# Patient Record
Sex: Male | Born: 1955 | Race: White | Hispanic: No | Marital: Single | Smoking: Never smoker
Health system: Southern US, Community
[De-identification: ages and names within clinical notes are randomized; demographics above are authoritative.]

## PROBLEM LIST (undated history)

## (undated) DIAGNOSIS — T8859XA Other complications of anesthesia, initial encounter: Secondary | ICD-10-CM

## (undated) DIAGNOSIS — N189 Chronic kidney disease, unspecified: Secondary | ICD-10-CM

## (undated) HISTORY — PX: SHOULDER ARTHROSCOPY: SHX128

## (undated) HISTORY — PX: BACK SURGERY: SHX140

---

## 2000-09-06 HISTORY — PX: TENDON REPAIR: SHX5111

## 2017-08-25 ENCOUNTER — Other Ambulatory Visit: Payer: Self-pay | Admitting: Nephrology

## 2017-08-25 DIAGNOSIS — N183 Chronic kidney disease, stage 3 unspecified: Secondary | ICD-10-CM

## 2017-09-01 ENCOUNTER — Ambulatory Visit
Admission: RE | Admit: 2017-09-01 | Discharge: 2017-09-01 | Disposition: A | Discharge status: Home / Self Care | Payer: 59 | Source: Ambulatory Visit | Attending: Nephrology | Admitting: Nephrology

## 2017-09-01 DIAGNOSIS — N183 Chronic kidney disease, stage 3 unspecified: Secondary | ICD-10-CM

## 2017-09-23 ENCOUNTER — Telehealth: Payer: Self-pay | Admitting: Hematology and Oncology

## 2017-09-23 NOTE — Telephone Encounter (Signed)
Spoke with and also left message on voicemail per his request with appointment date/time/location/phone #

## 2017-10-13 ENCOUNTER — Other Ambulatory Visit: Payer: Self-pay

## 2017-10-13 ENCOUNTER — Telehealth: Payer: Self-pay | Admitting: Hematology and Oncology

## 2017-10-13 ENCOUNTER — Inpatient Hospital Stay: Payer: 59 | Attending: Hematology and Oncology | Admitting: Hematology and Oncology

## 2017-10-13 ENCOUNTER — Inpatient Hospital Stay: Payer: 59

## 2017-10-13 ENCOUNTER — Encounter: Payer: Self-pay | Admitting: Hematology and Oncology

## 2017-10-13 VITALS — BP 139/83 | HR 73 | Temp 97.7°F | Resp 18 | Ht 70.0 in | Wt 229.6 lb

## 2017-10-13 DIAGNOSIS — Z7982 Long term (current) use of aspirin: Secondary | ICD-10-CM | POA: Diagnosis not present

## 2017-10-13 DIAGNOSIS — D472 Monoclonal gammopathy: Secondary | ICD-10-CM

## 2017-10-13 DIAGNOSIS — Z79899 Other long term (current) drug therapy: Secondary | ICD-10-CM | POA: Diagnosis not present

## 2017-10-13 DIAGNOSIS — N189 Chronic kidney disease, unspecified: Secondary | ICD-10-CM | POA: Insufficient documentation

## 2017-10-13 DIAGNOSIS — D801 Nonfamilial hypogammaglobulinemia: Secondary | ICD-10-CM | POA: Insufficient documentation

## 2017-10-13 LAB — CBC WITH DIFFERENTIAL (CANCER CENTER ONLY)
BASOS ABS: 0 10*3/uL (ref 0.0–0.1)
BASOS PCT: 0 %
EOS ABS: 0.3 10*3/uL (ref 0.0–0.5)
Eosinophils Relative: 4 %
HCT: 47.9 % (ref 38.4–49.9)
HEMOGLOBIN: 16.5 g/dL (ref 13.0–17.1)
Lymphocytes Relative: 21 %
Lymphs Abs: 1.5 10*3/uL (ref 0.9–3.3)
MCH: 32 pg (ref 27.2–33.4)
MCHC: 34.4 g/dL (ref 32.0–36.0)
MCV: 93 fL (ref 79.3–98.0)
MONOS PCT: 8 %
Monocytes Absolute: 0.6 10*3/uL (ref 0.1–0.9)
NEUTROS ABS: 4.9 10*3/uL (ref 1.5–6.5)
Neutrophils Relative %: 67 %
PLATELETS: 318 10*3/uL (ref 140–400)
RBC: 5.15 MIL/uL (ref 4.20–5.82)
RDW: 12.8 % (ref 11.0–14.6)
WBC Count: 7.3 10*3/uL (ref 4.0–10.3)
nRBC: 0 /100 WBC

## 2017-10-13 LAB — CMP (CANCER CENTER ONLY)
ALBUMIN: 3.3 g/dL — AB (ref 3.5–5.0)
ALK PHOS: 51 U/L (ref 40–150)
ALT: 32 U/L (ref 0–55)
ANION GAP: 8 (ref 3–11)
AST: 29 U/L (ref 5–34)
BUN: 21 mg/dL (ref 7–26)
CHLORIDE: 103 mmol/L (ref 98–109)
CO2: 29 mmol/L (ref 22–29)
Calcium: 9.2 mg/dL (ref 8.4–10.4)
Creatinine: 1.59 mg/dL — ABNORMAL HIGH (ref 0.70–1.30)
GFR, EST AFRICAN AMERICAN: 52 mL/min — AB (ref >60)
GFR, Estimated: 45 mL/min — ABNORMAL LOW (ref >60)
GLUCOSE: 89 mg/dL (ref 70–140)
Potassium: 4.5 mmol/L (ref 3.5–5.1)
SODIUM: 140 mmol/L (ref 136–145)
Total Bilirubin: 0.4 mg/dL (ref 0.2–1.2)
Total Protein: 6.8 g/dL (ref 6.4–8.3)

## 2017-10-13 LAB — URIC ACID: Uric Acid, Serum: 5.6 mg/dL (ref 2.6–7.4)

## 2017-10-13 LAB — LACTATE DEHYDROGENASE: LDH: 160 U/L (ref 125–245)

## 2017-10-13 NOTE — Telephone Encounter (Signed)
Patient scheduled and AVS/Calendar printed per 2/7 los

## 2017-10-14 LAB — KAPPA/LAMBDA LIGHT CHAINS
Kappa free light chain: 84.9 mg/L — ABNORMAL HIGH (ref 3.3–19.4)
Kappa, lambda light chain ratio: 6.48 — ABNORMAL HIGH (ref 0.26–1.65)
LAMDA FREE LIGHT CHAINS: 13.1 mg/L (ref 5.7–26.3)

## 2017-10-14 LAB — BETA 2 MICROGLOBULIN, SERUM: BETA 2 MICROGLOBULIN: 1.8 mg/L (ref 0.6–2.4)

## 2017-10-15 LAB — VISCOSITY, SERUM: Viscosity, Serum: 1.7 rel.saline (ref 1.6–1.9)

## 2017-10-17 LAB — MULTIPLE MYELOMA PANEL, SERUM
ALBUMIN SERPL ELPH-MCNC: 3.2 g/dL (ref 2.9–4.4)
ALPHA 1: 0.2 g/dL (ref 0.0–0.4)
ALPHA2 GLOB SERPL ELPH-MCNC: 1 g/dL (ref 0.4–1.0)
Albumin/Glob SerPl: 1.1 (ref 0.7–1.7)
B-Globulin SerPl Elph-Mcnc: 0.9 g/dL (ref 0.7–1.3)
GAMMA GLOB SERPL ELPH-MCNC: 1.1 g/dL (ref 0.4–1.8)
GLOBULIN, TOTAL: 3.2 g/dL (ref 2.2–3.9)
IGA: 195 mg/dL (ref 61–437)
IGG (IMMUNOGLOBIN G), SERUM: 1111 mg/dL (ref 700–1600)
IgM (Immunoglobulin M), Srm: 30 mg/dL (ref 20–172)
M Protein SerPl Elph-Mcnc: 0.7 g/dL — ABNORMAL HIGH
Total Protein ELP: 6.4 g/dL (ref 6.0–8.5)

## 2017-10-21 ENCOUNTER — Inpatient Hospital Stay (HOSPITAL_BASED_OUTPATIENT_CLINIC_OR_DEPARTMENT_OTHER): Payer: 59 | Admitting: Hematology and Oncology

## 2017-10-21 ENCOUNTER — Telehealth: Payer: Self-pay

## 2017-10-21 ENCOUNTER — Encounter: Payer: Self-pay | Admitting: Hematology and Oncology

## 2017-10-21 VITALS — BP 135/86 | HR 64 | Temp 97.8°F | Resp 18 | Ht 70.0 in | Wt 231.4 lb

## 2017-10-21 DIAGNOSIS — D472 Monoclonal gammopathy: Secondary | ICD-10-CM | POA: Diagnosis not present

## 2017-10-21 NOTE — Telephone Encounter (Signed)
Printed avs and calender for upcoming appointment. Per 2/15 los

## 2017-10-24 LAB — UIFE/LIGHT CHAINS/TP QN, 24-HR UR
% BETA, Urine: 18.1 %
ALPHA 1 URINE: 2.8 %
Albumin, U: 67 %
Alpha 2, Urine: 4.6 %
FREE KAPPA LT CHAINS, UR: 997 mg/L — AB (ref 1.35–24.19)
Free Kappa/Lambda Ratio: 209.89 — ABNORMAL HIGH (ref 2.04–10.37)
Free Lambda Lt Chains,Ur: 4.75 mg/L (ref 0.24–6.66)
GAMMA GLOBULIN URINE: 7.5 %
M-SPIKE %, URINE: 8.2 % — AB
M-Spike, Mg/24 Hr: 149 mg/24 hr — ABNORMAL HIGH
Total Protein, Urine-Ur/day: 1815 mg/24 hr — ABNORMAL HIGH (ref 30–150)
Total Protein, Urine: 93.1 mg/dL
Total Volume: 1950

## 2017-10-30 ENCOUNTER — Other Ambulatory Visit: Payer: Self-pay | Admitting: Radiology

## 2017-10-31 ENCOUNTER — Ambulatory Visit (HOSPITAL_COMMUNITY): Payer: 59

## 2017-11-01 ENCOUNTER — Encounter (HOSPITAL_COMMUNITY): Payer: 59

## 2017-11-03 ENCOUNTER — Encounter: Payer: Self-pay | Admitting: Hematology and Oncology

## 2017-11-03 ENCOUNTER — Telehealth: Payer: Self-pay

## 2017-11-03 DIAGNOSIS — D472 Monoclonal gammopathy: Secondary | ICD-10-CM

## 2017-11-03 HISTORY — DX: Monoclonal gammopathy: D47.2

## 2017-11-03 NOTE — Progress Notes (Signed)
[name] Follow-up Visit:  Assessment: [name] (monoclonal gammopathy of unknown significance) 62 y.o. 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  Repeat evaluation confirms presence and indicates possible progression of the monoclonal gammopathy over the  intervening 6 weeks.  Further evaluation is clearly indicated due to significant risk of multiple myeloma presence.  Plan: -PET/CT -Consult interventional radiology for bone marrow biopsy -Return to my clinic 2 weeks after bone marrow biopsy.  If testing is negative for multiple myeloma, will proceed with assessment for possible presence of amyloidosis..  Voice recognition software was used and creation of this note. Despite my best effort at editing the text, some misspelling/errors may have occurred.  Orders Placed This Encounter  Procedures  . NM PET Image Initial (PI) Whole Body    Standing Status:   Future    Standing Expiration Date:   10/21/2018    Order Specific Question:   If indicated for the ordered procedure, I authorize the administration of a radiopharmaceutical per Radiology protocol    Answer:   Yes    Order Specific Question:   Preferred imaging location?    Answer:   [facility]    Order Specific Question:   Radiology Contrast Protocol - do NOT remove file path    Answer:   \\charchive\epicdata\Radiant\NMPROTOCOLS.pdf    Order Specific Question:   Reason for Exam additional comments    Answer:   [name], please evaluate for evidence of skeletal lesions  . CT Biopsy    Standing Status:   Future    Standing Expiration Date:   10/21/2018    Order Specific Question:   Lab orders requested (DO NOT place separate lab orders, these will be automatically ordered during procedure specimen collection):    Answer:   Cytology - Non Pap    Comments:   Flow cyto, Cytogenetics, MM FISH panel, molecular studies for MM    Order Specific Question:   Lab  orders requested (DO NOT place separate lab orders, these will be automatically ordered during procedure specimen collection):    Answer:   Surgical Pathology    Order Specific Question:   Lab orders requested (DO NOT place separate lab orders, these will be automatically ordered during procedure specimen collection):    Answer:   Other    Order Specific Question:   Lab orders requested (DO NOT place separate lab orders, these will be automatically ordered during procedure specimen collection):    Answer:   Oligo clonal bands    Order Specific Question:   Reason for Exam (SYMPTOM  OR DIAGNOSIS REQUIRED)    Answer:   [name] -- please eval for evidence of MM    Order Specific Question:   Preferred imaging location?    Answer:   [facility]    Order Specific Question:   Radiology Contrast Protocol - do NOT remove file path    Answer:   \\charchive\epicdata\Radiant\CTProtocols.pdf  . CT BONE MARROW BIOPSY & ASPIRATION    Standing Status:   Future    Standing Expiration Date:   01/19/2019    Order Specific Question:   Reason for Exam (SYMPTOM  OR DIAGNOSIS REQUIRED)    Answer:   [name], please eval for Multiple myeloma    Order Specific Question:   Preferred imaging location?    Answer:   [facility]    Order Specific Question:   Radiology Contrast Protocol - do NOT remove file path  Answer:   \\charchive\epicdata\Radiant\CTProtocols.pdf  . IFE/Light Chains/TP Qn, 24-Hr Ur    Cancer Staging No matching staging information was found for the patient.  All questions were answered.   The patient knows to call the clinic with any problems, questions or concerns.  This note was electronically signed.    History of Presenting Illness Kevin [name] is a 62 y.o. male followed in the [place] for evaluation of monoclonal gammopathy of unknown significance, referred by Dr Madelon Lips.  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  Patient was also found to have proteinuria and anemia.  Initial testing outlined below demonstrated presence of a small band of monoclonal protein as well as profound pan hypogammaglobulinemia.  With those findings, he was referred to our evaluation.  At this time, patient denies any fever, chills, night sweats.  Denies unexpected weight loss or weight gain.  He lives a very healthy and active lifestyle and has not noted any new limitations to his activities.  Patient does have chronic mild neck and back arthritis which has not worsened recently.  Patient denies any numbness, tingling, or other sensory abnormalities in his extremities.  Denies any chest pain, shortness of breath, or cough.  No nausea, vomiting, abdominal pain, constipation, or urinary retention.  Oncological/hematological History: --Labs, 08/23/17: tProt   ..., Alb 4.1, Ca 9.5, Cr 1.5, AP .Marland Kitchen.;             SPEP -- MSpike 0.5g/dL, IFE -- IgG kappa; IgG  <30, IgA   <5, IgM <5; kappa 75.2, lambda 11.3, KLR 6.65; WBC 7.6, Hgb 16.9, MCV 90.0, MCH 31.5, MCHC 34.8, RDW 13.4, Plt 249; ANA negative, C3 115, C4 34; HBV, RPR, HIV -- negative;  --US Renal, 09/01/17: Normal renal ultrasound --Labs, 10/13/17: tProt 6.8, Alb 3.3, Ca 9.2, Cr 1.6, AP 51, LDH 160, beta-2 microglobulin 1.8; SPEP -- MSpike 0.7g/dL, IFE -- IgG kappa; IgG 1111, IgA 195, IgM 30; kappa 84.9, lambda 13.1, KLR 6.48; WBC 7.3, Hgb 16.5,        Plt 318;    No history exists.    Medical History: No past medical history on file.  Surgical History: Past Surgical History:  Procedure Laterality Date  . SHOULDER ARTHROSCOPY Left   . TENDON REPAIR Left 2002    Family History: Family History  Problem Relation Age of Onset  . Heart disease Mother   . Heart disease Father   . Heart attack Brother     Social History: Social History   Socioeconomic  History  . Marital status: Single    Spouse name: Not on file  . Number of children: Not on file  . Years of education: Not on file  . Highest education level: Not on file  Social Needs  . Financial resource strain: Not on file  . Food insecurity - worry: Not on file  . Food insecurity - inability: Not on file  . Transportation needs - medical: Not on file  . Transportation needs - non-medical: Not on file  Occupational History  . Not on file  Tobacco Use  . Smoking status: Never Smoker  . Smokeless tobacco: Never Used  Substance and Sexual Activity  . Alcohol use: No    Frequency: Never  . Drug use: No  . Sexual activity: Not on file  Other Topics Concern  . Not on file  Social History Narrative  . Not on file    Allergies: No Known Allergies  Medications:  Current Outpatient Medications  Medication Sig Dispense Refill  . aspirin 81 MG chewable tablet Chew 81 mg by mouth daily.     No current facility-administered medications for this visit.     Review of Systems: Review of Systems  All other systems reviewed and are negative.    PHYSICAL EXAMINATION Blood pressure 135/86, pulse 64, temperature 97.8 F (36.6 C), temperature source Oral, resp. rate 18, height '5\' 10"'  (1.778 m), weight 231 lb 6.4 oz (105 kg), SpO2 96 %.  ECOG PERFORMANCE STATUS: 0 - Asymptomatic  Physical Exam  Constitutional: He is oriented to person, place, and time and well-developed, well-nourished, and in no distress. No distress.  HENT:  Head: Normocephalic and atraumatic.  Right Ear: External ear normal.  Mouth/Throat: Oropharynx is clear and moist. No oropharyngeal exudate.  Eyes: Conjunctivae and EOM are normal. Pupils are equal, round, and reactive to light. No scleral icterus.  Neck: No thyromegaly present.  Cardiovascular: Normal rate, regular rhythm and normal heart sounds.  No murmur heard. Pulmonary/Chest: Effort normal and breath sounds normal. No respiratory distress. He has  no wheezes. He has no rales.  Abdominal: Soft. Bowel sounds are normal. He exhibits no distension and no mass. There is no tenderness. There is no guarding.  Musculoskeletal: He exhibits no edema.  Lymphadenopathy:    He has no cervical adenopathy.  Neurological: He is alert and oriented to person, place, and time. He has normal reflexes. No cranial nerve deficit.  Skin: Skin is warm and dry. No rash noted. He is not diaphoretic. No erythema. No pallor.     LABORATORY DATA: I have personally reviewed the data as listed: Office Visit on 10/21/2017  Component Date Value Ref Range Status  . Total Protein, Urine 10/21/2017 93.1  Not Estab. mg/dL Final  . Total Protein, Urine-Ur/day 10/21/2017 1,815* 30 - 150 mg/24 hr Final  . Albumin, U 10/21/2017 67.0  % Final  . ALPHA 1 URINE 10/21/2017 2.8  % Final  . Alpha 2, Urine 10/21/2017 4.6  % Final  . % BETA, Urine 10/21/2017 18.1  % Final  . GAMMA GLOBULIN URINE 10/21/2017 7.5  % Final  . Free Kappa Lt Chains,Ur 10/21/2017 997.00* 1.35 - 24.19 mg/L Final   **Results verified by repeat testing**  . Free Lambda Lt Chains,Ur 10/21/2017 4.75  0.24 - 6.66 mg/L Corrected   **Results verified by repeat testing**  . Free Kappa/Lambda Ratio 10/21/2017 209.89* 2.04 - 10.37 Corrected   Comment: (NOTE) Performed At: [facility] [address] [place], [place] [id number] Rush Farmer MD [id number]   . Immunofixation Result, Urine 10/21/2017 Comment   Corrected   Comment: (NOTE) Bence Jones Protein positive; kappa type. Immunofixation shows IgG monoclonal protein with kappa light chain specificity.   . Total Volume 10/21/2017 1,950   Final  . M-SPIKE %, Urine 10/21/2017 8.2* Not Observed % Corrected   Comment: An additional M spike was observed at a concentration of 4.0%.   Marland Kitchen M-Spike, Mg/24 Hr 10/21/2017 149* Not Observed mg/24 hr Corrected  . Note: 10/21/2017 Comment   Corrected   Comment: (NOTE) Protein electrophoresis scan will  follow via computer, mail, or courier delivery. Performed at [facility], [place] [address], [place], [place] [address]        Ardath Sax, MD

## 2017-11-03 NOTE — Telephone Encounter (Signed)
Matt from [facility] called requesting the progress note from patients office visit from 10/13/2017 and 10/21/2017.  This RN requested that Dr. Lebron Conners complete his notes.  The progress notes will be faxed to [facility] as soon as the notes are complete.

## 2017-11-03 NOTE — Assessment & Plan Note (Signed)
62 y.o. 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  Repeat evaluation confirms presence and indicates possible progression of the monoclonal gammopathy over the  intervening 6 weeks.  Further evaluation is clearly indicated due to significant risk of multiple myeloma presence.  Plan: -PET/CT -Consult interventional radiology for bone marrow biopsy -Return to my clinic 2 weeks after bone marrow biopsy.  If testing is negative for multiple myeloma, will proceed with assessment for possible presence of amyloidosis.Marland Kitchen

## 2017-11-03 NOTE — Progress Notes (Signed)
[place] [facility] New Visit:  Assessment: MGUS (monoclonal gammopathy of unknown significance) 62 y.o. 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  Findings are of unclear significance at this point in time and additional evaluation for possible presence of multiple myeloma is clearly indicated.  Plan: -Labs today as outlined below. -Skeletal survey. - Return to clinic in 1 week for review.  Voice recognition software was used and creation of this note. Despite my best effort at editing the text, some misspelling/errors may have occurred.  Orders Placed This Encounter  Procedures  . DG Bone Survey Met    Standing Status:   Future    Standing Expiration Date:   12/12/2018    Order Specific Question:   Reason for Exam (SYMPTOM  OR DIAGNOSIS REQUIRED)    Answer:   MGUS, please eval for lytic lesions to suggest Multiple myeloma    Order Specific Question:   Preferred imaging location?    Answer:   [facility]    Order Specific Question:   Radiology Contrast Protocol - do NOT remove file path    Answer:   \\charchive\epicdata\Radiant\DXFluoroContrastProtocols.pdf  . CBC with Differential ([place] Only)    Standing Status:   Future    Number of Occurrences:   1    Standing Expiration Date:   10/13/2018  . CMP ([place] only)    Standing Status:   Future    Number of Occurrences:   1    Standing Expiration Date:   10/13/2018  . Lactate dehydrogenase (LDH)    Standing Status:   Future    Number of Occurrences:   1    Standing Expiration Date:   10/13/2018  . Uric acid    Standing Status:   Future    Number of Occurrences:   1    Standing Expiration Date:   10/13/2018  . Beta 2 microglobulin    Standing Status:   Future    Number of Occurrences:   1    Standing Expiration Date:   10/13/2018  . Multiple Myeloma Panel (SPEP&IFE w/QIG)    Standing Status:   Future    Number of Occurrences:   1    Standing  Expiration Date:   10/13/2018  . Kappa/lambda light chains    Standing Status:   Future    Number of Occurrences:   1    Standing Expiration Date:   10/13/2018  . Viscosity, serum    Standing Status:   Future    Number of Occurrences:   1    Standing Expiration Date:   10/13/2018  . 24-Hr Ur UPEP/UIFE/Light Chains/TP    Standing Status:   Future    Number of Occurrences:   1    Standing Expiration Date:   10/13/2018    All questions were answered. . The patient knows to call the clinic with any problems, questions or concerns.  This note was electronically signed.    History of Presenting Illness Kevin [name] 62 y.o. presenting to the [facility] for evaluation of monoclonal gammopathy of unknown significance, referred by Dr Madelon Lips.  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  Patient was also found to have proteinuria and anemia.  Initial testing outlined below demonstrated presence of a small band of monoclonal protein as well as profound  pan hypogammaglobulinemia.  With those findings, he was referred to our evaluation.  At this time, patient denies any fever, chills, night sweats.  Denies unexpected weight loss or weight gain.  He lives a very healthy and active lifestyle and has not noted any new limitations to his activities.  Patient does have chronic mild neck and back arthritis which has not worsened recently.  Patient denies any numbness, tingling, or other sensory abnormalities in his extremities.  Denies any chest pain, shortness of breath, or cough.  No nausea, vomiting, abdominal pain, constipation, or urinary retention.  Oncological/hematological History: --Labs, 08/23/17: tProt ..., Alb 4.1, Ca 9.5, Cr 1.5, AP ...; SPEP -- MSpike 0.5g/dL, IFE -- IgG kappa; IgG <30, IgA <5, IgM <5; kappa 75.2, lambda 11.3,  KLR 6.65; WBC 7.6, Hgb 16.9, MCV 90.0, MCH 31.5, MCHC 34.8, RDW 13.4, Plt 249; ANA negative, C3 115, C4 34; HBV, RPR, HIV -- negative;  --US Renal, 09/01/17: Normal renal ultrasound   Medical History: History reviewed. No pertinent past medical history.  Surgical History: Past Surgical History:  Procedure Laterality Date  . SHOULDER ARTHROSCOPY Left   . TENDON REPAIR Left 2002    Family History: Family History  Problem Relation Age of Onset  . Heart disease Mother   . Heart disease Father   . Heart attack Brother     Social History: Social History   Socioeconomic History  . Marital status: Single    Spouse name: Not on file  . Number of children: Not on file  . Years of education: Not on file  . Highest education level: Not on file  Social Needs  . Financial resource strain: Not on file  . Food insecurity - worry: Not on file  . Food insecurity - inability: Not on file  . Transportation needs - medical: Not on file  . Transportation needs - non-medical: Not on file  Occupational History  . Not on file  Tobacco Use  . Smoking status: Never Smoker  . Smokeless tobacco: Never Used  Substance and Sexual Activity  . Alcohol use: No    Frequency: Never  . Drug use: No  . Sexual activity: Not on file  Other Topics Concern  . Not on file  Social History Narrative  . Not on file    Allergies: No Known Allergies  Medications:  Current Outpatient Medications  Medication Sig Dispense Refill  . aspirin 81 MG chewable tablet Chew 81 mg by mouth daily.     No current facility-administered medications for this visit.     Review of Systems: Review of Systems  All other systems reviewed and are negative.    PHYSICAL EXAMINATION Blood pressure 139/83, pulse 73, temperature 97.7 F (36.5 C), temperature source Oral, resp. rate 18, height _0  (1.778 m), weight 229 lb 9.6 oz (104.1 kg), SpO2 98 %.  ECOG PERFORMANCE STATUS: 0 - Asymptomatic  Physical Exam   Constitutional: He is oriented to person, place, and time and well-developed, well-nourished, and in no distress. No distress.  HENT:  Head: Normocephalic and atraumatic.  Right Ear: External ear normal.  Mouth/Throat: Oropharynx is clear and moist. No oropharyngeal exudate.  Eyes: Conjunctivae and EOM are normal. Pupils are equal, round, and reactive to light. No scleral icterus.  Neck: No thyromegaly present.  Cardiovascular: Normal rate, regular rhythm and normal heart sounds.  No murmur heard. Pulmonary/Chest: Effort normal and breath sounds normal. No respiratory distress. He has no wheezes. He has no rales.  Abdominal: Soft.  Bowel sounds are normal. He exhibits no distension and no mass. There is no tenderness. There is no guarding.  Musculoskeletal: He exhibits no edema.  Lymphadenopathy:    He has no cervical adenopathy.  Neurological: He is alert and oriented to person, place, and time. He has normal reflexes. No cranial nerve deficit.  Skin: Skin is warm and dry. No rash noted. He is not diaphoretic. No erythema. No pallor.     LABORATORY DATA: I have personally reviewed the data as listed: Appointment on 10/13/2017  Component Date Value Ref Range Status  . WBC Count 10/13/2017 7.3  4.0 - 10.3 K/uL Final  . RBC 10/13/2017 5.15  4.20 - 5.82 MIL/uL Final  . Hemoglobin 10/13/2017 16.5  13.0 - 17.1 g/dL Final  . HCT 10/13/2017 47.9  38.4 - 49.9 % Final  . MCV 10/13/2017 93.0  79.3 - 98.0 fL Final  . MCH 10/13/2017 32.0  27.2 - 33.4 pg Final  . MCHC 10/13/2017 34.4  32.0 - 36.0 g/dL Final  . RDW 10/13/2017 12.8  11.0 - 14.6 % Final  . Platelet Count 10/13/2017 318  140 - 400 K/uL Final  . Neutrophils Relative % 10/13/2017 67  % Final  . Neutro Abs 10/13/2017 4.9  1.5 - 6.5 K/uL Final  . Lymphocytes Relative 10/13/2017 21  % Final  . Lymphs Abs 10/13/2017 1.5  0.9 - 3.3 K/uL Final  . Monocytes Relative 10/13/2017 8  % Final  . Monocytes Absolute 10/13/2017 0.6  0.1 - 0.9  K/uL Final  . Eosinophils Relative 10/13/2017 4  % Final  . Eosinophils Absolute 10/13/2017 0.3  0.0 - 0.5 K/uL Final  . Basophils Relative 10/13/2017 0  % Final  . Basophils Absolute 10/13/2017 0.0  0.0 - 0.1 K/uL Final  . nRBC 10/13/2017 0  0 /100 WBC Final   Performed at [facility], [place] [address] [place], [place] [address]  . Sodium 10/13/2017 140  136 - 145 mmol/L Final  . Potassium 10/13/2017 4.5  3.5 - 5.1 mmol/L Final  . Chloride 10/13/2017 103  98 - 109 mmol/L Final  . CO2 10/13/2017 29  22 - 29 mmol/L Final  . Glucose, Bld 10/13/2017 89  70 - 140 mg/dL Final  . BUN 10/13/2017 21  7 - 26 mg/dL Final  . Creatinine 10/13/2017 1.59* 0.70 - 1.30 mg/dL Final  . Calcium 10/13/2017 9.2  8.4 - 10.4 mg/dL Final  . Total Protein 10/13/2017 6.8  6.4 - 8.3 g/dL Final  . Albumin 10/13/2017 3.3* 3.5 - 5.0 g/dL Final  . AST 10/13/2017 29  5 - 34 U/L Final  . ALT 10/13/2017 32  0 - 55 U/L Final  . Alkaline Phosphatase 10/13/2017 51  40 - 150 U/L Final  . Total Bilirubin 10/13/2017 0.4  0.2 - 1.2 mg/dL Final  . GFR, Est Non Af Am 10/13/2017 45* >60 mL/min Final  . GFR, Est AFR Am 10/13/2017 52* >60 mL/min Final   Comment: (NOTE) The eGFR has been calculated using the CKD EPI equation. This calculation has not been validated in all clinical situations. eGFR's persistently <60 mL/min signify possible Chronic Kidney Disease.   Georgiann Hahn gap 10/13/2017 8  3 - 11 Final   Performed at [facility], [place] [address], [place], [place] [address]  . LDH 10/13/2017 160  125 - 245 U/L Final   Performed at [facility], [place] [address], [place], [place] [address]  .  Uric Acid, Serum 10/13/2017 5.6  2.6 - 7.4 mg/dL Final   Performed at [facility], [place] [address], [place], [place] [address]  . Beta-2 Microglobulin 10/13/2017 1.8  0.6 - 2.4 mg/L Final   Comment: (NOTE) Siemens Immulite 2000  Immunochemiluminometric assay (ICMA) Values obtained with different assay methods or kits cannot be used interchangeably. Results cannot be interpreted as absolute evidence of the presence or absence of malignant disease. Performed At: [facility] [place], [place] [id number] Rush Farmer MD [id number] Performed at [facility], [place] [address], [place], [place] [address]   . IgG (Immunoglobin G), Serum 10/13/2017 1,111  700 - 1,600 mg/dL Final  . IgA 10/13/2017 195  61 - 437 mg/dL Final  . IgM (Immunoglobulin M), Srm 10/13/2017 30  20 - 172 mg/dL Final  . Total Protein ELP 10/13/2017 6.4  6.0 - 8.5 g/dL Corrected  . Albumin SerPl Elph-Mcnc 10/13/2017 3.2  2.9 - 4.4 g/dL Corrected  . Alpha 1 10/13/2017 0.2  0.0 - 0.4 g/dL Corrected  . Alpha2 Glob SerPl Elph-Mcnc 10/13/2017 1.0  0.4 - 1.0 g/dL Corrected  . B-Globulin SerPl Elph-Mcnc 10/13/2017 0.9  0.7 - 1.3 g/dL Corrected  . Gamma Glob SerPl Elph-Mcnc 10/13/2017 1.1  0.4 - 1.8 g/dL Corrected  . M Protein SerPl Elph-Mcnc 10/13/2017 0.7* Not Observed g/dL Corrected  . Globulin, Total 10/13/2017 3.2  2.2 - 3.9 g/dL Corrected  . Albumin/Glob SerPl 10/13/2017 1.1  0.7 - 1.7 Corrected  . IFE 1 10/13/2017 Comment   Corrected   Comment: (NOTE) Immunofixation shows IgG monoclonal protein with kappa light chain specificity.   . Please Note 10/13/2017 Comment   Corrected   Comment: (NOTE) Protein electrophoresis scan will follow via computer, mail, or courier delivery. Performed At: [facility] [place], [place] [id number] Rush Farmer MD [id number] Performed at [facility], [place] [address] [place], [place] [address]   . Kappa free light chain 10/13/2017 84.9* 3.3 - 19.4 mg/L Final  . Lamda free light chains 10/13/2017 13.1  5.7 - 26.3 mg/L Final  . Kappa, lamda light chain ratio 10/13/2017 6.48* 0.26 - 1.65 Final   Comment:  (NOTE) Performed At: [facility] [place], [place] [id number] Rush Farmer MD [id number] Performed at [facility], [place] [address] [place], [place] [address]   . Viscosity, Serum 10/13/2017 1.7  1.6 - 1.9 rel.saline Final   Comment: (NOTE) Values above 2.7 may indicate paraproteinemia is present. This test was developed and its performance characteristics determined by [facility]. It has not been cleared or approved by the Food and Drug Administration. Performed At: [facility] [place], [place] [id number] Rush Farmer MD [id number] Performed at [facility], [place] [address] [place], [place] [address]          Ardath Sax, MD

## 2017-11-03 NOTE — Assessment & Plan Note (Signed)
62 y.o. 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  Findings are of unclear significance at this point in time and additional evaluation for possible presence of multiple myeloma is clearly indicated.  Plan: -Labs today as outlined below. -Skeletal survey. - Return to clinic in 1 week for review.

## 2017-11-04 ENCOUNTER — Ambulatory Visit: Payer: 59 | Admitting: Hematology and Oncology

## 2018-02-09 ENCOUNTER — Telehealth: Payer: Self-pay | Admitting: Hematology and Oncology

## 2018-02-09 NOTE — Telephone Encounter (Signed)
Called pt re appts being moved to [place] - left vm for pt re appts and sent confirmation letter

## 2018-02-17 ENCOUNTER — Other Ambulatory Visit: Payer: Self-pay | Admitting: Radiology

## 2018-02-19 ENCOUNTER — Other Ambulatory Visit: Payer: Self-pay | Admitting: Radiology

## 2018-02-20 ENCOUNTER — Ambulatory Visit (HOSPITAL_COMMUNITY)
Admission: RE | Admit: 2018-02-20 | Discharge: 2018-02-20 | Disposition: A | Discharge status: Home / Self Care | Payer: 59 | Source: Ambulatory Visit | Attending: Family Medicine | Admitting: Family Medicine

## 2018-02-20 ENCOUNTER — Ambulatory Visit (HOSPITAL_COMMUNITY)
Admission: RE | Admit: 2018-02-20 | Discharge: 2018-02-20 | Disposition: A | Discharge status: Home / Self Care | Payer: 59 | Source: Ambulatory Visit | Attending: Hematology and Oncology | Admitting: Hematology and Oncology

## 2018-02-20 ENCOUNTER — Encounter (HOSPITAL_COMMUNITY): Payer: Self-pay

## 2018-02-20 DIAGNOSIS — Z7982 Long term (current) use of aspirin: Secondary | ICD-10-CM | POA: Diagnosis not present

## 2018-02-20 DIAGNOSIS — N289 Disorder of kidney and ureter, unspecified: Secondary | ICD-10-CM | POA: Diagnosis not present

## 2018-02-20 DIAGNOSIS — D472 Monoclonal gammopathy: Secondary | ICD-10-CM | POA: Diagnosis not present

## 2018-02-20 LAB — CBC WITH DIFFERENTIAL/PLATELET
BASOS ABS: 0 10*3/uL (ref 0.0–0.1)
Basophils Relative: 0 %
EOS ABS: 0.1 10*3/uL (ref 0.0–0.7)
Eosinophils Relative: 1 %
HCT: 48.9 % (ref 39.0–52.0)
Hemoglobin: 17 g/dL (ref 13.0–17.0)
Lymphocytes Relative: 19 %
Lymphs Abs: 1.3 10*3/uL (ref 0.7–4.0)
MCH: 32.4 pg (ref 26.0–34.0)
MCHC: 34.8 g/dL (ref 30.0–36.0)
MCV: 93.3 fL (ref 78.0–100.0)
MONO ABS: 0.6 10*3/uL (ref 0.1–1.0)
MONOS PCT: 9 %
Neutro Abs: 5 10*3/uL (ref 1.7–7.7)
Neutrophils Relative %: 71 %
PLATELETS: 260 10*3/uL (ref 150–400)
RBC: 5.24 MIL/uL (ref 4.22–5.81)
RDW: 13 % (ref 11.5–15.5)
WBC: 7.1 10*3/uL (ref 4.0–10.5)

## 2018-02-20 LAB — BASIC METABOLIC PANEL
Anion gap: 8 (ref 5–15)
BUN: 26 mg/dL — ABNORMAL HIGH (ref 6–20)
CALCIUM: 9.6 mg/dL (ref 8.9–10.3)
CO2: 24 mmol/L (ref 22–32)
CREATININE: 1.73 mg/dL — AB (ref 0.61–1.24)
Chloride: 105 mmol/L (ref 101–111)
GFR, EST AFRICAN AMERICAN: 47 mL/min — AB (ref >60)
GFR, EST NON AFRICAN AMERICAN: 41 mL/min — AB (ref >60)
GLUCOSE: 93 mg/dL (ref 65–99)
Potassium: 4.3 mmol/L (ref 3.5–5.1)
Sodium: 137 mmol/L (ref 135–145)

## 2018-02-20 LAB — APTT: APTT: 28 s (ref 24–36)

## 2018-02-20 LAB — PROTIME-INR
INR: 0.91
PROTHROMBIN TIME: 12.2 s (ref 11.4–15.2)

## 2018-02-20 MED ORDER — NALOXONE HCL 0.4 MG/ML IJ SOLN
INTRAMUSCULAR | Status: AC
  Filled 2018-02-20: qty 1

## 2018-02-20 MED ORDER — FENTANYL CITRATE (PF) 100 MCG/2ML IJ SOLN
INTRAMUSCULAR | Status: AC | PRN
  Administered 2018-02-20 (×2): 50 ug via INTRAVENOUS

## 2018-02-20 MED ORDER — FENTANYL CITRATE (PF) 100 MCG/2ML IJ SOLN
INTRAMUSCULAR | Status: AC
  Filled 2018-02-20: qty 6

## 2018-02-20 MED ORDER — MIDAZOLAM HCL 2 MG/2ML IJ SOLN
INTRAMUSCULAR | Status: AC | PRN
  Administered 2018-02-20 (×2): 1 mg via INTRAVENOUS

## 2018-02-20 MED ORDER — FLUMAZENIL 0.5 MG/5ML IV SOLN
INTRAVENOUS | Status: AC
  Filled 2018-02-20: qty 5

## 2018-02-20 MED ORDER — MIDAZOLAM HCL 2 MG/2ML IJ SOLN
INTRAMUSCULAR | Status: AC
  Filled 2018-02-20: qty 6

## 2018-02-20 MED ORDER — SODIUM CHLORIDE 0.9 % IV SOLN
INTRAVENOUS | Status: DC
  Administered 2018-02-20: 08:00:00 via INTRAVENOUS

## 2018-02-20 NOTE — Discharge Instructions (Signed)
Bone Marrow Aspiration and Bone Marrow Biopsy, Adult, Care After °This sheet gives you information about how to care for yourself after your procedure. Your health care provider may also give you more specific instructions. If you have problems or questions, contact your health care provider. °What can I expect after the procedure? °After the procedure, it is common to have: °· Mild pain and tenderness. °· Swelling. °· Bruising. ° °Follow these instructions at home: °· Take over-the-counter or prescription medicines only as told by your health care provider. °· Do not take baths, swim, or use a hot tub until your health care provider approves. Ask if you can take a shower or have a sponge bath. °· Follow instructions from your health care provider about how to take care of the puncture site. Make sure you: °? Wash your hands with soap and water before you change your bandage (dressing). If soap and water are not available, use hand sanitizer. °? Change your dressing as told by your health care provider. °· Check your puncture site every day for signs of infection. Check for: °? More redness, swelling, or pain. °? More fluid or blood. °? Warmth. °? Pus or a bad smell. °· Return to your normal activities as told by your health care provider. Ask your health care provider what activities are safe for you. °· Do not drive for 24 hours if you were given a medicine to help you relax (sedative). °· Keep all follow-up visits as told by your health care provider. This is important. °Contact a health care provider if: °· You have more redness, swelling, or pain around the puncture site. °· You have more fluid or blood coming from the puncture site. °· Your puncture site feels warm to the touch. °· You have pus or a bad smell coming from the puncture site. °· You have a fever. °· Your pain is not controlled with medicine. °This information is not intended to replace advice given to you by your health care provider. Make sure  you discuss any questions you have with your health care provider. °Document Released: 03/12/2005 Document Revised: 03/12/2016 Document Reviewed: 02/04/2016 °Elsevier Interactive Patient Education © 2018 Elsevier Inc. °Moderate Conscious Sedation, Adult, Care After °These instructions provide you with information about caring for yourself after your procedure. Your health care provider may also give you more specific instructions. Your treatment has been planned according to current medical practices, but problems sometimes occur. Call your health care provider if you have any problems or questions after your procedure. °What can I expect after the procedure? °After your procedure, it is common: °· To feel sleepy for several hours. °· To feel clumsy and have poor balance for several hours. °· To have poor judgment for several hours. °· To vomit if you eat too soon. ° °Follow these instructions at home: °For at least 24 hours after the procedure: ° °· Do not: °? Participate in activities where you could fall or become injured. °? Drive. °? Use heavy machinery. °? Drink alcohol. °? Take sleeping pills or medicines that cause drowsiness. °? Make important decisions or sign legal documents. °? Take care of children on your own. °· Rest. °Eating and drinking °· Follow the diet recommended by your health care provider. °· If you vomit: °? Drink water, juice, or soup when you can drink without vomiting. °? Make sure you have little or no nausea before eating solid foods. °General instructions °· Have a responsible adult stay with you until you are   awake and alert.  Take over-the-counter and prescription medicines only as told by your health care provider.  If you smoke, do not smoke without supervision.  Keep all follow-up visits as told by your health care provider. This is important. Contact a health care provider if:  You keep feeling nauseous or you keep vomiting.  You feel light-headed.  You develop a  rash.  You have a fever. Get help right away if:  You have trouble breathing. This information is not intended to replace advice given to you by your health care provider. Make sure you discuss any questions you have with your health care provider. Document Released: 06/13/2013 Document Revised: 01/26/2016 Document Reviewed: 12/13/2015 Elsevier Interactive Patient Education  [employer].

## 2018-02-20 NOTE — Consult Note (Signed)
Chief Complaint: Patient was seen in consultation today for CT-guided bone marrow biopsy  Referring Physician(s): Perlov,Mikhail G  Supervising Physician: Sandi Mariscal  Patient Status: Export  History of Present Illness: Kevin [name] is a 62 y.o. male with history of MGUS and renal insufficiency who presents today for CT guided bone marrow biopsy for further evaluation/rule out myeloma.   History reviewed. No pertinent past medical history.  Past Surgical History:  Procedure Laterality Date  . SHOULDER ARTHROSCOPY Left   . TENDON REPAIR Left 2002    Allergies: Patient has no known allergies.  Medications: Prior to Admission medications   Medication Sig Start Date End Date Taking? Authorizing Provider  aspirin 81 MG chewable tablet Chew 81 mg by mouth daily.   Yes [provider]  Multiple Vitamin (MULTIVITAMIN) tablet Take 1 tablet by mouth daily.   Yes [provider]     Family History  Problem Relation Age of Onset  . Heart disease Mother   . Heart disease Father   . Heart attack Brother     Social History   Socioeconomic History  . Marital status: Single    Spouse name: Not on file  . Number of children: Not on file  . Years of education: Not on file  . Highest education level: Not on file  Occupational History  . Not on file  Social Needs  . Financial resource strain: Not on file  . Food insecurity:    Worry: Not on file    Inability: Not on file  . Transportation needs:    Medical: Not on file    Non-medical: Not on file  Tobacco Use  . Smoking status: Never Smoker  . Smokeless tobacco: Never Used  Substance and Sexual Activity  . Alcohol use: No    Frequency: Never  . Drug use: No  . Sexual activity: Not on file  Lifestyle  . Physical activity:    Days per week: Not on file    Minutes per session: Not on file  . Stress: Not on file  Relationships  . Social connections:    Talks on phone: Not on file    Gets  together: Not on file    Attends religious service: Not on file    Active member of club or organization: Not on file    Attends meetings of clubs or organizations: Not on file    Relationship status: Not on file  Other Topics Concern  . Not on file  Social History Narrative  . Not on file      Review of Systems denies fever, headache, chest pain, dyspnea, cough, abdominal/back pain, nausea, vomiting or bleeding. Vital Signs: BP (!) 144/85 (BP Location: Right Arm)   Pulse 69   Temp 97.6 F (36.4 C) (Oral)   Resp 18   SpO2 95%   Physical Exam awake, alert.  Chest clear to auscultation bilaterally.  Heart with regular rate and rhythm.  Abdomen soft, positive bowel sounds, nontender.  No lower extremity edema.  Imaging: No results found.  Labs:  CBC: Recent Labs    10/13/17 1457 02/20/18 0725  WBC 7.3 7.1  HGB 16.5 17.0  HCT 47.9 48.9  PLT 318 260    COAGS: Recent Labs    02/20/18 0725  INR 0.91  APTT 28    BMP: Recent Labs    10/13/17 1457 02/20/18 0725  NA 140 137  K 4.5 4.3  CL 103 105  CO2 29 24  GLUCOSE 89 93  BUN 21 26*  CALCIUM 9.2 9.6  CREATININE 1.59* 1.73*  GFRNONAA 45* 41*  GFRAA 52* 47*    LIVER FUNCTION TESTS: Recent Labs    10/13/17 1457  BILITOT 0.4  AST 29  ALT 32  ALKPHOS 51  PROT 6.8  ALBUMIN 3.3*    TUMOR MARKERS: No results for input(s): AFPTM, CEA, CA199, CHROMGRNA in the last 8760 hours.  Assessment and Plan: 62 y.o. male with history of MGUS and renal insufficiency who presents today for CT guided bone marrow biopsy for further evaluation/rule out myeloma.Risks and benefits discussed with the patient including, but not limited to bleeding, infection, damage to adjacent structures or low yield requiring additional tests.  All of the patient's questions were answered, patient is agreeable to proceed. Consent signed and in chart.     Thank you for this interesting consult.  I greatly enjoyed meeting Kevin [name] and look forward to participating in their care.  A copy of this report was sent to the requesting provider on this date.  Electronically Signed: D. Rowe Robert, PA-C 02/20/2018, 8:14 AM   I spent a total of 25 minutes    in face to face in clinical consultation, greater than 50% of which was counseling/coordinating care for CT-guided bone marrow biopsy

## 2018-02-20 NOTE — Procedures (Signed)
Pre-procedure Diagnosis: MGUS Post-procedure Diagnosis: Same  Technically successful CT guided bone marrow aspiration and biopsy of left iliac crest.   Complications: None Immediate  EBL: None  SignedBHARAT, ANTILLON Pager: [phone] 02/20/2018, 9:27 AM

## 2018-02-21 ENCOUNTER — Encounter (HOSPITAL_COMMUNITY): Payer: 59

## 2018-02-24 ENCOUNTER — Encounter (HOSPITAL_COMMUNITY): Payer: Self-pay | Admitting: Hematology and Oncology

## 2018-04-04 ENCOUNTER — Telehealth: Payer: Self-pay | Admitting: Hematology

## 2018-04-04 NOTE — Telephone Encounter (Signed)
Patient called to cancel °

## 2018-04-05 ENCOUNTER — Ambulatory Visit: Payer: 59 | Admitting: Hematology and Oncology

## 2018-04-06 ENCOUNTER — Inpatient Hospital Stay: Payer: 59 | Admitting: Hematology

## 2018-07-18 ENCOUNTER — Other Ambulatory Visit: Payer: Self-pay | Admitting: Family Medicine

## 2018-07-18 ENCOUNTER — Ambulatory Visit
Admission: RE | Admit: 2018-07-18 | Discharge: 2018-07-18 | Disposition: A | Discharge status: Home / Self Care | Payer: 59 | Source: Ambulatory Visit | Attending: Family Medicine | Admitting: Family Medicine

## 2018-07-18 DIAGNOSIS — M5136 Other intervertebral disc degeneration, lumbar region: Secondary | ICD-10-CM

## 2018-07-18 DIAGNOSIS — M5416 Radiculopathy, lumbar region: Secondary | ICD-10-CM

## 2018-07-20 ENCOUNTER — Other Ambulatory Visit: Payer: Self-pay | Admitting: Family Medicine

## 2018-07-20 DIAGNOSIS — M4310 Spondylolisthesis, site unspecified: Secondary | ICD-10-CM

## 2018-07-20 DIAGNOSIS — M5416 Radiculopathy, lumbar region: Secondary | ICD-10-CM

## 2018-07-20 DIAGNOSIS — M5136 Other intervertebral disc degeneration, lumbar region: Secondary | ICD-10-CM

## 2018-07-30 ENCOUNTER — Ambulatory Visit
Admission: RE | Admit: 2018-07-30 | Discharge: 2018-07-30 | Disposition: A | Discharge status: Home / Self Care | Payer: 59 | Source: Ambulatory Visit | Attending: Family Medicine | Admitting: Family Medicine

## 2018-07-30 DIAGNOSIS — M5416 Radiculopathy, lumbar region: Secondary | ICD-10-CM

## 2018-07-30 DIAGNOSIS — M5136 Other intervertebral disc degeneration, lumbar region: Secondary | ICD-10-CM

## 2018-07-30 DIAGNOSIS — M4310 Spondylolisthesis, site unspecified: Secondary | ICD-10-CM

## 2018-09-29 ENCOUNTER — Other Ambulatory Visit (HOSPITAL_COMMUNITY): Payer: Self-pay | Admitting: Nephrology

## 2018-09-29 ENCOUNTER — Other Ambulatory Visit: Payer: Self-pay | Admitting: Nephrology

## 2018-09-29 DIAGNOSIS — D472 Monoclonal gammopathy: Secondary | ICD-10-CM

## 2018-09-29 DIAGNOSIS — N183 Chronic kidney disease, stage 3 unspecified: Secondary | ICD-10-CM

## 2018-09-29 DIAGNOSIS — R809 Proteinuria, unspecified: Secondary | ICD-10-CM

## 2018-10-09 ENCOUNTER — Other Ambulatory Visit: Payer: Self-pay | Admitting: Radiology

## 2018-10-10 ENCOUNTER — Ambulatory Visit (HOSPITAL_COMMUNITY)
Admission: RE | Admit: 2018-10-10 | Discharge: 2018-10-10 | Disposition: A | Discharge status: Home / Self Care | Payer: 59 | Source: Ambulatory Visit | Attending: Nephrology | Admitting: Nephrology

## 2018-10-10 ENCOUNTER — Encounter (HOSPITAL_COMMUNITY): Payer: Self-pay

## 2018-10-10 DIAGNOSIS — N183 Chronic kidney disease, stage 3 unspecified: Secondary | ICD-10-CM

## 2018-10-10 DIAGNOSIS — D472 Monoclonal gammopathy: Secondary | ICD-10-CM | POA: Diagnosis not present

## 2018-10-10 DIAGNOSIS — Z79899 Other long term (current) drug therapy: Secondary | ICD-10-CM | POA: Diagnosis not present

## 2018-10-10 DIAGNOSIS — R809 Proteinuria, unspecified: Secondary | ICD-10-CM | POA: Diagnosis not present

## 2018-10-10 DIAGNOSIS — Z7982 Long term (current) use of aspirin: Secondary | ICD-10-CM | POA: Diagnosis not present

## 2018-10-10 HISTORY — DX: Chronic kidney disease, unspecified: N18.9

## 2018-10-10 LAB — PROTIME-INR
INR: 0.89
Prothrombin Time: 11.9 seconds (ref 11.4–15.2)

## 2018-10-10 LAB — CBC
HEMATOCRIT: 48.9 % (ref 39.0–52.0)
HEMOGLOBIN: 16.4 g/dL (ref 13.0–17.0)
MCH: 31.4 pg (ref 26.0–34.0)
MCHC: 33.5 g/dL (ref 30.0–36.0)
MCV: 93.7 fL (ref 80.0–100.0)
Platelets: 208 10*3/uL (ref 150–400)
RBC: 5.22 MIL/uL (ref 4.22–5.81)
RDW: 12.4 % (ref 11.5–15.5)
WBC: 7.2 10*3/uL (ref 4.0–10.5)
nRBC: 0 % (ref 0.0–0.2)

## 2018-10-10 MED ORDER — MIDAZOLAM HCL 2 MG/2ML IJ SOLN
INTRAMUSCULAR | Status: AC
  Filled 2018-10-10: qty 2

## 2018-10-10 MED ORDER — FENTANYL CITRATE (PF) 100 MCG/2ML IJ SOLN
INTRAMUSCULAR | Status: AC
  Filled 2018-10-10: qty 2

## 2018-10-10 MED ORDER — SODIUM CHLORIDE 0.9 % IV SOLN
INTRAVENOUS | Status: AC | PRN
  Administered 2018-10-10: 10 mL/h via INTRAVENOUS

## 2018-10-10 MED ORDER — GELATIN ABSORBABLE 12-7 MM EX MISC
CUTANEOUS | Status: AC
  Filled 2018-10-10: qty 1

## 2018-10-10 MED ORDER — LIDOCAINE HCL (PF) 1 % IJ SOLN
INTRAMUSCULAR | Status: AC
  Filled 2018-10-10: qty 30

## 2018-10-10 MED ORDER — SODIUM CHLORIDE 0.9 % IV SOLN: INTRAVENOUS | Status: DC

## 2018-10-10 MED ORDER — FENTANYL CITRATE (PF) 100 MCG/2ML IJ SOLN
INTRAMUSCULAR | Status: AC | PRN
  Administered 2018-10-10: 25 ug via INTRAVENOUS
  Administered 2018-10-10: 50 ug via INTRAVENOUS
  Administered 2018-10-10: 25 ug via INTRAVENOUS

## 2018-10-10 MED ORDER — MIDAZOLAM HCL 2 MG/2ML IJ SOLN
INTRAMUSCULAR | Status: AC | PRN
  Administered 2018-10-10: 0.5 mg via INTRAVENOUS
  Administered 2018-10-10: 1 mg via INTRAVENOUS
  Administered 2018-10-10: 0.5 mg via INTRAVENOUS

## 2018-10-10 NOTE — Procedures (Signed)
Interventional Radiology Procedure Note  Procedure: US guided left renal biopsy. Medical renal   Complications: None  Recommendations:  - Ok to shower tomorrow - Do not submerge for 7 days - Routine care  - 3 hours recovery - advance diet   Signed,  Dulcy Fanny. Earleen Newport, DO

## 2018-10-10 NOTE — Discharge Instructions (Addendum)
Percutaneous Kidney Biopsy, Care After °This sheet gives you information about how to care for yourself after your procedure. Your health care provider may also give you more specific instructions. If you have problems or questions, contact your health care provider. °What can I expect after the procedure? °After the procedure, it is common to have: °· Pain or soreness near the area where the needle went through your skin (biopsy site). °· Bright pink or cloudy urine for 24 hours after the procedure. °Follow these instructions at home: °Activity °· Return to your normal activities as told by your health care provider. Ask your health care provider what activities are safe for you. °· Do not drive for 24 hours if you were given a medicine to help you relax (sedative). °· Do not lift anything that is heavier than 10 lb (4.5 kg) until your health care provider tells you that it is safe. °· Avoid activities that take a lot of effort (are strenuous) until your health care provider approves. Most people will have to wait 2 weeks before returning to activities such as exercise or sexual intercourse. °General instructions ° °· Take over-the-counter and prescription medicines only as told by your health care provider. °· You may eat and drink after your procedure. Follow instructions from your health care provider about eating or drinking restrictions. °· Check your biopsy site every day for signs of infection. Check for: °? More redness, swelling, or pain. °? More fluid or blood. °? Warmth. °? Pus or a bad smell. °· Keep all follow-up visits as told by your health care provider. This is important. °Contact a health care provider if: °· You have more redness, swelling, or pain around your biopsy site. °· You have more fluid or blood coming from your biopsy site. °· Your biopsy site feels warm to the touch. °· You have pus or a bad smell coming from your biopsy site. °· You have blood in your urine more than 24 hours after  your procedure. °Get help right away if: °· You have dark red or brown urine. °· You have a fever. °· You are unable to urinate. °· You feel burning when you urinate. °· You feel faint. °· You have severe pain in your abdomen or side. °This information is not intended to replace advice given to you by your health care provider. Make sure you discuss any questions you have with your health care provider. °Document Released: 04/25/2013 Document Revised: 06/04/2016 Document Reviewed: 06/04/2016 °Elsevier Interactive Patient Education © 2019 Elsevier Inc. °Moderate Conscious Sedation, Adult, Care After °These instructions provide you with information about caring for yourself after your procedure. Your health care provider may also give you more specific instructions. Your treatment has been planned according to current medical practices, but problems sometimes occur. Call your health care provider if you have any problems or questions after your procedure. °What can I expect after the procedure? °After your procedure, it is common: °· To feel sleepy for several hours. °· To feel clumsy and have poor balance for several hours. °· To have poor judgment for several hours. °· To vomit if you eat too soon. °Follow these instructions at home: °For at least 24 hours after the procedure: ° °· Do not: °? Participate in activities where you could fall or become injured. °? Drive. °? Use heavy machinery. °? Drink alcohol. °? Take sleeping pills or medicines that cause drowsiness. °? Make important decisions or sign legal documents. °? Take care of children on   your own. °· Rest. °Eating and drinking °· Follow the diet recommended by your health care provider. °· If you vomit: °? Drink water, juice, or soup when you can drink without vomiting. °? Make sure you have little or no nausea before eating solid foods. °General instructions °· Have a responsible adult stay with you until you are awake and alert. °· Take over-the-counter  and prescription medicines only as told by your health care provider. °· If you smoke, do not smoke without supervision. °· Keep all follow-up visits as told by your health care provider. This is important. °Contact a health care provider if: °· You keep feeling nauseous or you keep vomiting. °· You feel light-headed. °· You develop a rash. °· You have a fever. °Get help right away if: °· You have trouble breathing. °This information is not intended to replace advice given to you by your health care provider. Make sure you discuss any questions you have with your health care provider. °Document Released: 06/13/2013 Document Revised: 01/26/2016 Document Reviewed: 12/13/2015 °Elsevier Interactive Patient Education © 2019 Elsevier Inc. ° °

## 2018-10-10 NOTE — H&P (Signed)
Chief Complaint: Patient was seen in consultation today for random renal biopsy at the request of [place]  Referring Physician(s): Upton,Elizabeth  Supervising Physician: Corrie Mckusick  Patient Status: [facility] - Out-[facility]  History of Present Illness: Kevin [name] is a 63 y.o. male   Known Chronic kidney disease for yrs Rising  Creatinine Proteinuria  BM Bx 02/2018 Interpretation Bone Marrow Flow Cytometry - NO MONOCLONAL B-CELL OR PHENOTYPICALLY ABERRANT T-CELL POPULATION IDENTIFIED.  Scheduled now random renal biopsy    Past Medical History:  Diagnosis Date  . Chronic kidney disease     Past Surgical History:  Procedure Laterality Date  . BACK SURGERY    . SHOULDER ARTHROSCOPY Left   . TENDON REPAIR Left 2002    Allergies: Nsaids  Medications: Prior to Admission medications   Medication Sig Start Date End Date Taking? Authorizing Provider  aspirin EC 81 MG tablet Take 81 mg by mouth daily.   Yes [provider]  Cholecalciferol ([employer] VITAMIN D3) 250 MCG (10000 UT) CAPS Take 10,000 Units by mouth daily.   Yes [provider]  Multiple Vitamin (MULTIVITAMIN) tablet Take 1 tablet by mouth daily.   Yes [provider]     Family History  Problem Relation Age of Onset  . Heart disease Mother   . Heart disease Father   . Heart attack Brother     Social History   Socioeconomic History  . Marital status: Single    Spouse name: Not on file  . Number of children: Not on file  . Years of education: Not on file  . Highest education level: Not on file  Occupational History  . Not on file  Social Needs  . Financial resource strain: Not on file  . Food insecurity:    Worry: Not on file    Inability: Not on file  . Transportation needs:    Medical: Not on file    Non-medical: Not on file  Tobacco Use  . Smoking status: Never Smoker  . Smokeless tobacco: Never Used  Substance and Sexual Activity  . Alcohol use: No   Frequency: Never  . Drug use: No  . Sexual activity: Not on file  Lifestyle  . Physical activity:    Days per week: Not on file    Minutes per session: Not on file  . Stress: Not on file  Relationships  . Social connections:    Talks on phone: Not on file    Gets together: Not on file    Attends religious service: Not on file    Active member of club or organization: Not on file    Attends meetings of clubs or organizations: Not on file    Relationship status: Not on file  Other Topics Concern  . Not on file  Social History Narrative  . Not on file    Review of Systems: A 12 point ROS discussed and pertinent positives are indicated in the HPI above.  All other systems are negative.  Review of Systems  Constitutional: Negative for activity change, fatigue and fever.  Respiratory: Negative for cough and shortness of breath.   Cardiovascular: Negative for chest pain.  Gastrointestinal: Negative for abdominal pain.  Musculoskeletal: Negative for back pain.  Neurological: Negative for weakness.  Psychiatric/Behavioral: Negative for behavioral problems and confusion.    Vital Signs: BP 134/82   Pulse 83   Temp 98.8 F (37.1 C) (Oral)   Resp 16   Ht 5\' 10"  (1.778 m)  Wt 226 lb (102.5 kg)   SpO2 98%   BMI 32.43 kg/m   Physical Exam Vitals signs reviewed.  Cardiovascular:     Rate and Rhythm: Regular rhythm.     Heart sounds: Normal heart sounds.  Pulmonary:     Effort: Pulmonary effort is normal.     Breath sounds: Normal breath sounds.  Abdominal:     General: Bowel sounds are normal.     Palpations: Abdomen is soft.  Musculoskeletal: Normal range of motion.  Skin:    General: Skin is warm and dry.  Neurological:     General: No focal deficit present.     Mental Status: He is alert and oriented to person, place, and time.  Psychiatric:        Mood and Affect: Mood normal.        Behavior: Behavior normal.        Thought Content: Thought content normal.          Judgment: Judgment normal.     Imaging: No results found.  Labs:  CBC: Recent Labs    10/13/17 1457 02/20/18 0725 10/10/18 0551  WBC 7.3 7.1 7.2  HGB 16.5 17.0 16.4  HCT 47.9 48.9 48.9  PLT 318 260 208    COAGS: Recent Labs    02/20/18 0725 10/10/18 0551  INR 0.91 0.89  APTT 28  --     BMP: Recent Labs    10/13/17 1457 02/20/18 0725  NA 140 137  K 4.5 4.3  CL 103 105  CO2 29 24  GLUCOSE 89 93  BUN 21 26*  CALCIUM 9.2 9.6  CREATININE 1.59* 1.73*  GFRNONAA 45* 41*  GFRAA 52* 47*    LIVER FUNCTION TESTS: Recent Labs    10/13/17 1457  BILITOT 0.4  AST 29  ALT 32  ALKPHOS 51  PROT 6.8  ALBUMIN 3.3*    TUMOR MARKERS: No results for input(s): AFPTM, CEA, CA199, CHROMGRNA in the last 8760 hours.  Assessment and Plan:  CKD 3 Rising Cr Proteinuria Scheduled for random renal biopsy Risks and benefits discussed with the patient including, but not limited to bleeding, infection, damage to adjacent structures or low yield requiring additional tests.  All of the patient's questions were answered, patient is agreeable to proceed. Consent signed and in chart.   Thank you for this interesting consult.  I greatly enjoyed meeting Kevin [name] and look forward to participating in their care.  A copy of this report was sent to the requesting provider on this date.  Electronically Signed: Lavonia Drafts, PA-C 10/10/2018, 7:05 AM   I spent a total of  30 Minutes   in face to face in clinical consultation, greater than 50% of which was counseling/coordinating care for random renal bx

## 2018-11-02 ENCOUNTER — Encounter (HOSPITAL_COMMUNITY): Payer: Self-pay | Admitting: Nephrology

## 2019-07-23 IMAGING — US US BIOPSY
1 series · 11 of 11 positions shown · non-contrast
Comparison: none

INDICATION: 62-year-old male with a history of proteinuria

[Series 1: us biopsy · 11 acquisitions, 11 frames shown]
[im 1/11]
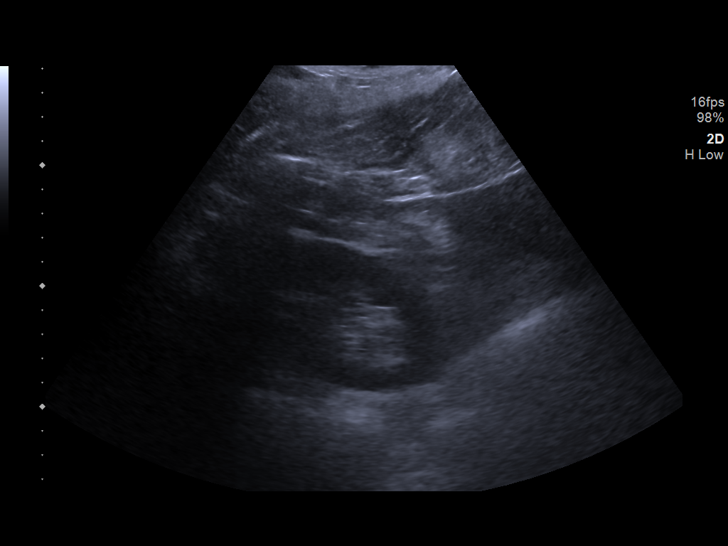
[im 2/11]
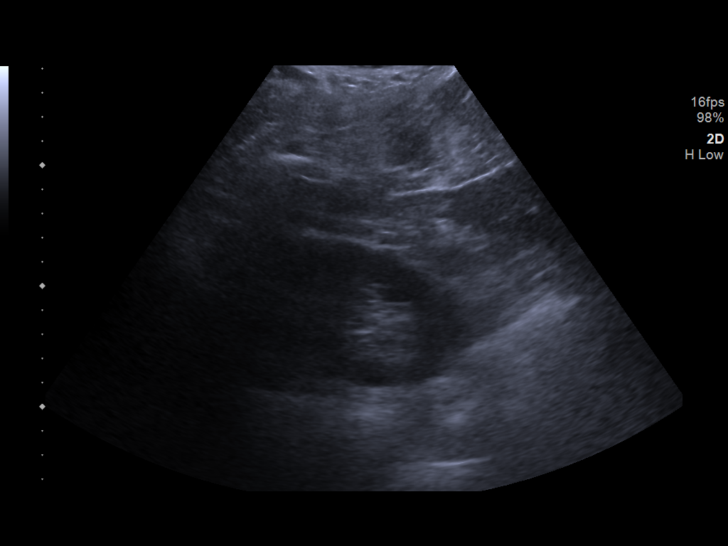
[im 3/11]
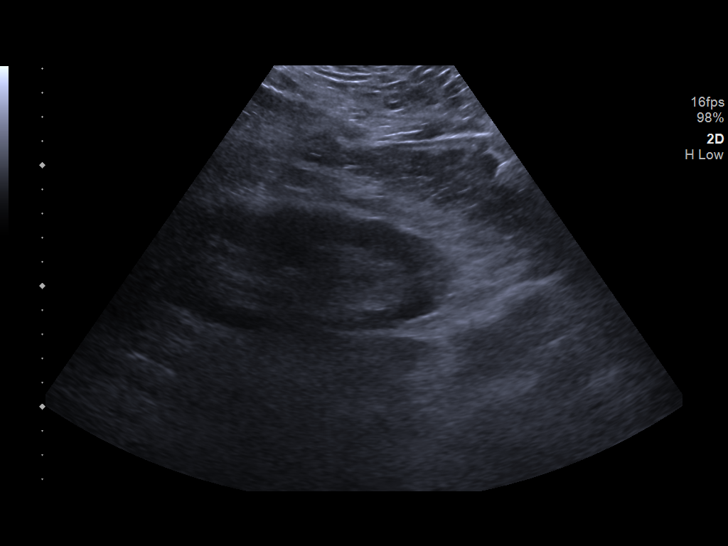
[im 4/11]
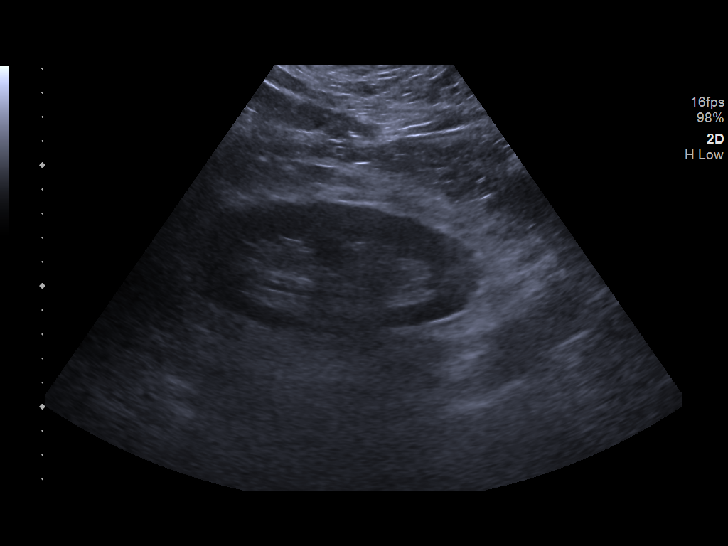
[im 5/11]
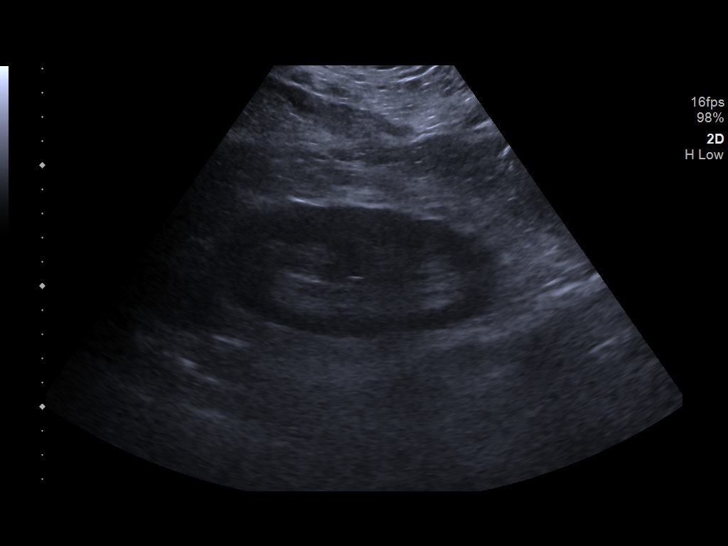
[im 6/11]
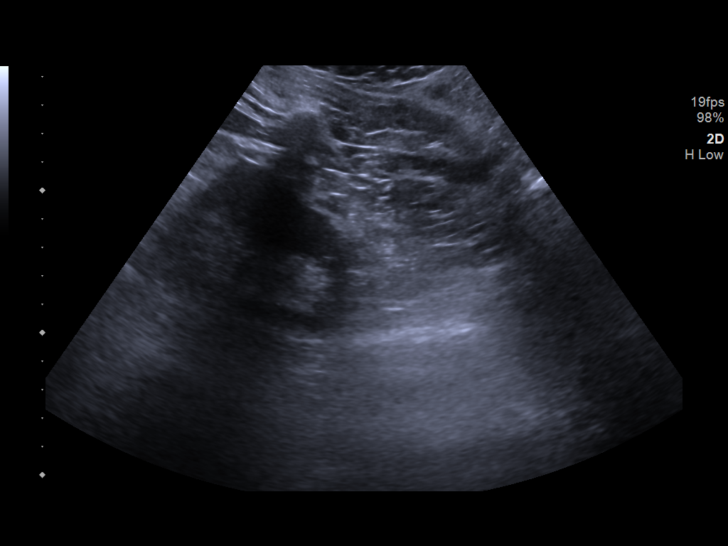
[im 7/11]
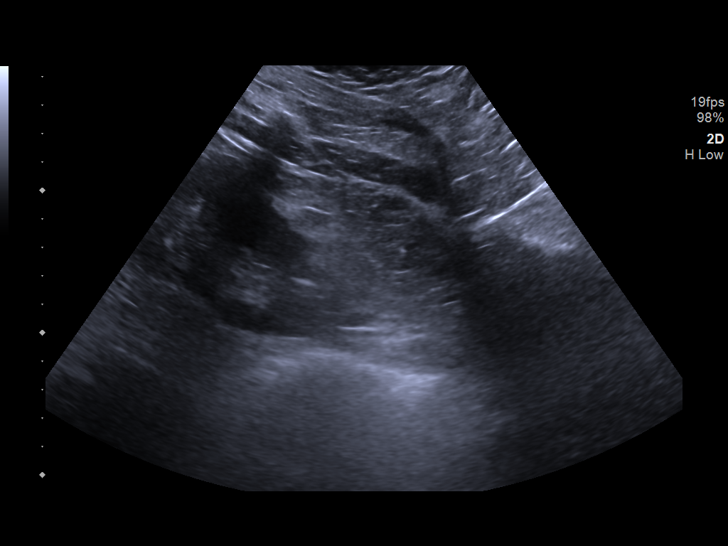
[im 8/11]
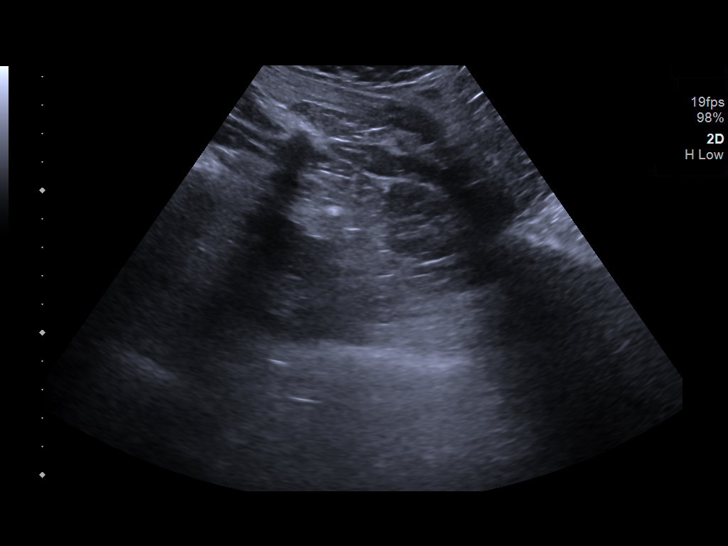
[im 9/11]
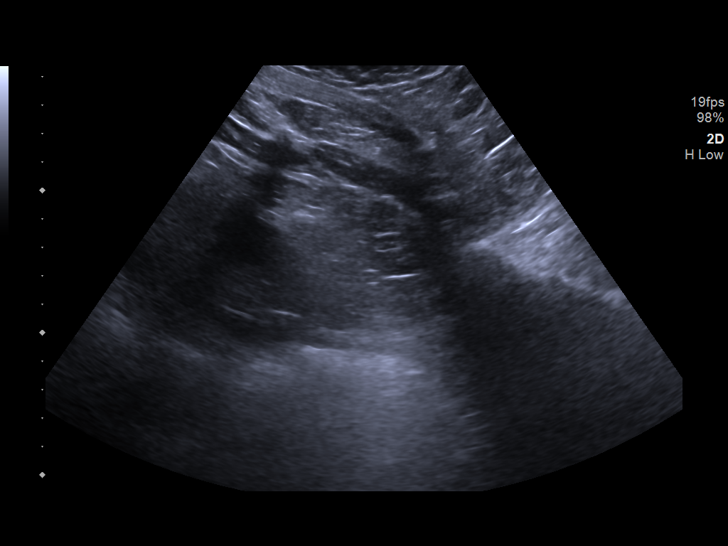
[im 10/11]
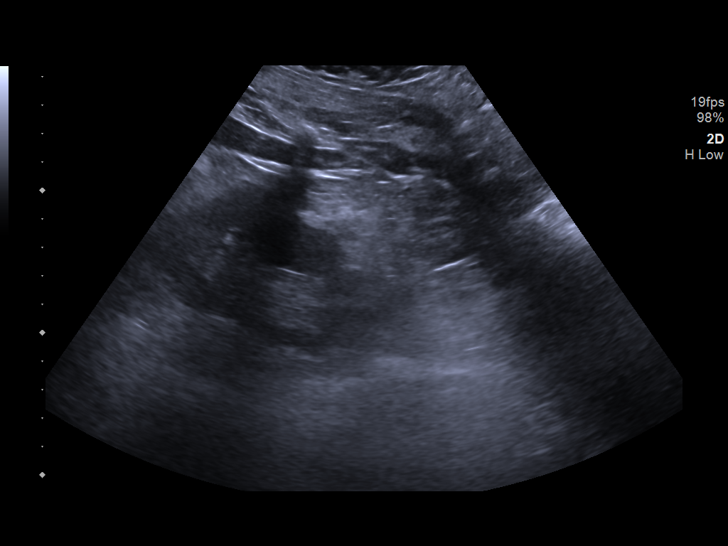
[im 11/11]
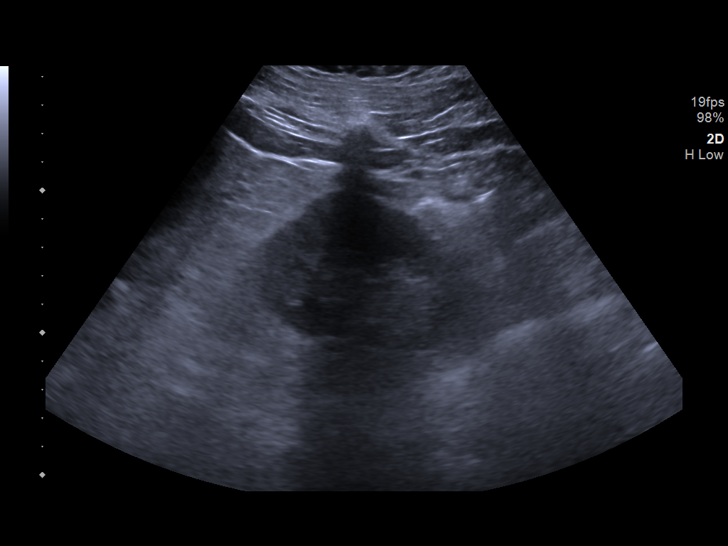

[11 of 11 positions shown; findings below may reference images not displayed]

EXAM:
ULTRASOUND-GUIDED MEDICAL RENAL BIOPSY

MEDICATIONS:
None.

ANESTHESIA/SEDATION:
Moderate (conscious) sedation was employed during this procedure. A
total of Versed 2.0 mg and Fentanyl 100 mcg was administered
intravenously.

Moderate Sedation Time: 14 minutes. The patient's level of
consciousness and vital signs were monitored continuously by
radiology nursing throughout the procedure under my direct
supervision.

FLUOROSCOPY TIME:  None

COMPLICATIONS:
None

PROCEDURE:
Informed written consent was obtained from the patient after a
thorough discussion of the procedural risks, benefits and
alternatives. All questions were addressed. Maximal Sterile Barrier
Technique was utilized including caps, mask, sterile gowns, sterile
gloves, sterile drape, hand hygiene and skin antiseptic. A timeout
was performed prior to the initiation of the procedure.

Patient was positioned prone position on the gantry table. Images
were stored sent to PACs.

Once the patient is prepped and draped in the usual sterile fashion,
the skin and subcutaneous tissues overlying the left kidney were
generously infiltrated 1% lidocaine for local anesthesia.

Using ultrasound guidance, a 15 gauge guide needle was advanced into
the lower cortex of the left kidney.

Once we confirmed location of the needle tip, 2 separate 16 gauge
core biopsy were achieved.

Two Gel-Foam pledgets were infused with a small amount of saline.
The needle was removed.

Final images were stored.

The patient tolerated the procedure well and remained
hemodynamically stable throughout.

No complications were encountered and no significant blood loss
encountered.
IMPRESSION: Status post ultrasound-guided medical renal biopsy.

## 2019-10-12 ENCOUNTER — Other Ambulatory Visit (HOSPITAL_COMMUNITY): Payer: Self-pay | Admitting: Family Medicine

## 2019-10-12 DIAGNOSIS — Z8249 Family history of ischemic heart disease and other diseases of the circulatory system: Secondary | ICD-10-CM

## 2019-10-25 ENCOUNTER — Other Ambulatory Visit (HOSPITAL_COMMUNITY): Payer: 59

## 2019-10-26 ENCOUNTER — Telehealth (HOSPITAL_COMMUNITY): Payer: Self-pay | Admitting: Family Medicine

## 2019-10-26 NOTE — Telephone Encounter (Signed)
Left patient message of new appt date ad time and asked patient to call to confirm if appt date and time is good. LBW 10/26/2019

## 2019-11-08 ENCOUNTER — Other Ambulatory Visit (HOSPITAL_COMMUNITY): Payer: 59

## 2019-11-09 ENCOUNTER — Encounter (INDEPENDENT_AMBULATORY_CARE_PROVIDER_SITE_OTHER): Payer: Self-pay

## 2019-11-09 ENCOUNTER — Other Ambulatory Visit: Payer: Self-pay

## 2019-11-09 ENCOUNTER — Ambulatory Visit (HOSPITAL_COMMUNITY): Payer: 59 | Attending: Internal Medicine

## 2019-11-09 DIAGNOSIS — I082 Rheumatic disorders of both aortic and tricuspid valves: Secondary | ICD-10-CM | POA: Insufficient documentation

## 2019-11-09 DIAGNOSIS — Z8249 Family history of ischemic heart disease and other diseases of the circulatory system: Secondary | ICD-10-CM | POA: Insufficient documentation

## 2019-11-09 DIAGNOSIS — E785 Hyperlipidemia, unspecified: Secondary | ICD-10-CM | POA: Diagnosis not present

## 2019-11-09 DIAGNOSIS — N189 Chronic kidney disease, unspecified: Secondary | ICD-10-CM | POA: Insufficient documentation

## 2019-11-09 DIAGNOSIS — I77810 Thoracic aortic ectasia: Secondary | ICD-10-CM | POA: Diagnosis not present

## 2023-01-24 DIAGNOSIS — Z20822 Contact with and (suspected) exposure to covid-19: Secondary | ICD-10-CM | POA: Diagnosis not present

## 2023-01-24 DIAGNOSIS — R972 Elevated prostate specific antigen [PSA]: Secondary | ICD-10-CM | POA: Diagnosis not present

## 2023-02-04 DIAGNOSIS — D472 Monoclonal gammopathy: Secondary | ICD-10-CM | POA: Diagnosis not present

## 2023-02-04 DIAGNOSIS — E785 Hyperlipidemia, unspecified: Secondary | ICD-10-CM | POA: Diagnosis not present

## 2023-02-04 DIAGNOSIS — R972 Elevated prostate specific antigen [PSA]: Secondary | ICD-10-CM | POA: Diagnosis not present

## 2023-02-04 DIAGNOSIS — N1831 Chronic kidney disease, stage 3a: Secondary | ICD-10-CM | POA: Diagnosis not present

## 2023-02-04 DIAGNOSIS — R399 Unspecified symptoms and signs involving the genitourinary system: Secondary | ICD-10-CM | POA: Diagnosis not present

## 2023-04-20 DIAGNOSIS — E785 Hyperlipidemia, unspecified: Secondary | ICD-10-CM | POA: Diagnosis not present

## 2023-04-20 DIAGNOSIS — Z8249 Family history of ischemic heart disease and other diseases of the circulatory system: Secondary | ICD-10-CM | POA: Diagnosis not present

## 2023-04-20 DIAGNOSIS — I129 Hypertensive chronic kidney disease with stage 1 through stage 4 chronic kidney disease, or unspecified chronic kidney disease: Secondary | ICD-10-CM | POA: Diagnosis not present

## 2023-04-20 DIAGNOSIS — Z6830 Body mass index (BMI) 30.0-30.9, adult: Secondary | ICD-10-CM | POA: Diagnosis not present

## 2023-04-20 DIAGNOSIS — Z7984 Long term (current) use of oral hypoglycemic drugs: Secondary | ICD-10-CM | POA: Diagnosis not present

## 2023-04-20 DIAGNOSIS — N189 Chronic kidney disease, unspecified: Secondary | ICD-10-CM | POA: Diagnosis not present

## 2023-04-20 DIAGNOSIS — E669 Obesity, unspecified: Secondary | ICD-10-CM | POA: Diagnosis not present

## 2023-05-13 DIAGNOSIS — N1831 Chronic kidney disease, stage 3a: Secondary | ICD-10-CM | POA: Diagnosis not present

## 2023-07-04 DIAGNOSIS — N1831 Chronic kidney disease, stage 3a: Secondary | ICD-10-CM | POA: Diagnosis not present

## 2023-07-15 DIAGNOSIS — R399 Unspecified symptoms and signs involving the genitourinary system: Secondary | ICD-10-CM | POA: Diagnosis not present

## 2023-07-15 DIAGNOSIS — N1831 Chronic kidney disease, stage 3a: Secondary | ICD-10-CM | POA: Diagnosis not present

## 2023-07-15 DIAGNOSIS — E785 Hyperlipidemia, unspecified: Secondary | ICD-10-CM | POA: Diagnosis not present

## 2023-07-15 DIAGNOSIS — D472 Monoclonal gammopathy: Secondary | ICD-10-CM | POA: Diagnosis not present

## 2023-11-07 DIAGNOSIS — D472 Monoclonal gammopathy: Secondary | ICD-10-CM | POA: Diagnosis not present

## 2023-11-07 DIAGNOSIS — N1831 Chronic kidney disease, stage 3a: Secondary | ICD-10-CM | POA: Diagnosis not present

## 2023-11-16 DIAGNOSIS — D472 Monoclonal gammopathy: Secondary | ICD-10-CM | POA: Diagnosis not present

## 2023-11-16 DIAGNOSIS — D582 Other hemoglobinopathies: Secondary | ICD-10-CM | POA: Diagnosis not present

## 2023-11-16 DIAGNOSIS — R809 Proteinuria, unspecified: Secondary | ICD-10-CM | POA: Diagnosis not present

## 2023-11-16 DIAGNOSIS — N1831 Chronic kidney disease, stage 3a: Secondary | ICD-10-CM | POA: Diagnosis not present

## 2023-12-07 ENCOUNTER — Encounter: Admitting: Family

## 2023-12-07 ENCOUNTER — Inpatient Hospital Stay: Payer: Self-pay

## 2023-12-13 ENCOUNTER — Other Ambulatory Visit: Payer: Self-pay | Admitting: Family

## 2023-12-13 DIAGNOSIS — D472 Monoclonal gammopathy: Secondary | ICD-10-CM

## 2023-12-13 DIAGNOSIS — D751 Secondary polycythemia: Secondary | ICD-10-CM

## 2023-12-14 ENCOUNTER — Inpatient Hospital Stay: Payer: Self-pay | Admitting: Family

## 2023-12-14 ENCOUNTER — Inpatient Hospital Stay

## 2023-12-14 ENCOUNTER — Inpatient Hospital Stay: Attending: Hematology & Oncology

## 2023-12-14 ENCOUNTER — Encounter: Payer: Self-pay | Admitting: Family

## 2023-12-14 VITALS — BP 133/60 | HR 67 | Temp 97.7°F | Resp 18 | Wt 223.0 lb

## 2023-12-14 DIAGNOSIS — Z79899 Other long term (current) drug therapy: Secondary | ICD-10-CM | POA: Diagnosis not present

## 2023-12-14 DIAGNOSIS — D472 Monoclonal gammopathy: Secondary | ICD-10-CM | POA: Insufficient documentation

## 2023-12-14 DIAGNOSIS — N183 Chronic kidney disease, stage 3 unspecified: Secondary | ICD-10-CM

## 2023-12-14 DIAGNOSIS — D751 Secondary polycythemia: Secondary | ICD-10-CM | POA: Diagnosis not present

## 2023-12-14 DIAGNOSIS — Z7982 Long term (current) use of aspirin: Secondary | ICD-10-CM | POA: Insufficient documentation

## 2023-12-14 LAB — CBC WITH DIFFERENTIAL (CANCER CENTER ONLY)
Abs Immature Granulocytes: 0.02 10*3/uL (ref 0.00–0.07)
Basophils Absolute: 0.1 10*3/uL (ref 0.0–0.1)
Basophils Relative: 1 %
Eosinophils Absolute: 0.2 10*3/uL (ref 0.0–0.5)
Eosinophils Relative: 2 %
HCT: 52.4 % — ABNORMAL HIGH (ref 39.0–52.0)
Hemoglobin: 18.2 g/dL — ABNORMAL HIGH (ref 13.0–17.0)
Immature Granulocytes: 0 %
Lymphocytes Relative: 20 %
Lymphs Abs: 1.5 10*3/uL (ref 0.7–4.0)
MCH: 32.1 pg (ref 26.0–34.0)
MCHC: 34.7 g/dL (ref 30.0–36.0)
MCV: 92.4 fL (ref 80.0–100.0)
Monocytes Absolute: 0.4 10*3/uL (ref 0.1–1.0)
Monocytes Relative: 6 %
Neutro Abs: 5 10*3/uL (ref 1.7–7.7)
Neutrophils Relative %: 71 %
Platelet Count: 232 10*3/uL (ref 150–400)
RBC: 5.67 MIL/uL (ref 4.22–5.81)
RDW: 13.6 % (ref 11.5–15.5)
WBC Count: 7.2 10*3/uL (ref 4.0–10.5)
nRBC: 0 % (ref 0.0–0.2)

## 2023-12-14 LAB — RETICULOCYTES
Immature Retic Fract: 8.7 % (ref 2.3–15.9)
RBC.: 5.67 MIL/uL (ref 4.22–5.81)
Retic Count, Absolute: 64.1 10*3/uL (ref 19.0–186.0)
Retic Ct Pct: 1.1 % (ref 0.4–3.1)

## 2023-12-14 LAB — CMP (CANCER CENTER ONLY)
ALT: 17 U/L (ref 0–44)
AST: 19 U/L (ref 15–41)
Albumin: 4 g/dL (ref 3.5–5.0)
Alkaline Phosphatase: 58 U/L (ref 38–126)
Anion gap: 6 (ref 5–15)
BUN: 34 mg/dL — ABNORMAL HIGH (ref 8–23)
CO2: 26 mmol/L (ref 22–32)
Calcium: 9.1 mg/dL (ref 8.9–10.3)
Chloride: 101 mmol/L (ref 98–111)
Creatinine: 1.69 mg/dL — ABNORMAL HIGH (ref 0.61–1.24)
GFR, Estimated: 44 mL/min — ABNORMAL LOW (ref >60)
Glucose, Bld: 93 mg/dL (ref 70–99)
Potassium: 4.1 mmol/L (ref 3.5–5.1)
Sodium: 133 mmol/L — ABNORMAL LOW (ref 135–145)
Total Bilirubin: 0.5 mg/dL (ref 0.0–1.2)
Total Protein: 6.6 g/dL (ref 6.5–8.1)

## 2023-12-14 LAB — IRON AND IRON BINDING CAPACITY (CC-WL,HP ONLY)
Iron: 152 ug/dL (ref 45–182)
Saturation Ratios: 42 % — ABNORMAL HIGH (ref 17.9–39.5)
TIBC: 360 ug/dL (ref 250–450)
UIBC: 208 ug/dL (ref 117–376)

## 2023-12-14 LAB — FERRITIN: Ferritin: 37 ng/mL (ref 24–336)

## 2023-12-14 LAB — SAVE SMEAR(SSMR), FOR PROVIDER SLIDE REVIEW

## 2023-12-14 NOTE — Progress Notes (Signed)
 Hematology/Oncology Consultation   Name: Kevin [name]      MRN: [id number]    Location: Room/bed info not found  Date: 12/14/2023 Time:8:54 AM   REFERRING PHYSICIAN:  Rogers Blocker, PA-C  REASON FOR CONSULT: Elevated Hgb    DIAGNOSIS: Erythrocytosis and MGUS  HISTORY OF PRESENT ILLNESS:  Mr. Kevin [name] is a pleasant 68 yo gentlemen with history of MGUS as well as new erythrocytosis over the last couple months noted by PCP.  He was seen in the past by Dr. Judie Petit. Perlov for the MGUS.  His complexion overall is ruddy.  Hgb is 18.2 (previously 18.7 in March), MCV 92, platelets 232 and WBC count 7.2.  He has stage III CKD. Kidney biopsy back in 10/2018 was negative.  Most recently his M-spike was 0.8 with PCP. Full protein panel as well as a 24 hour urine were ordered today and results are pending.  He had also had a bone marrow biopsy back in 02/2018 that was negative.  No headaches, blurred or loss of vision.  He denies fatigue and works out 5 days a week.  No smoking, ETOH or recreational drug use.  He states that he does snore but is not interested in having a sleep study.  He is a veteran of [employer] and we are thankful for his service. He was at camp [employer] and was exposed to the contaminated water. He is not using any hormone replacement therapy such as testosterone.  No history of diabetes or thyroid disease.  No personal history of heart disease, liver disease or thrombotic event. He states that heart disease is strong on both his maternal and paternal sides of the family. He states that his last colonoscopy was at age 74 and was negative. He is due again in 2 years.  No personal or known familial history of cancer.  No issue with frequent or recurrent infections.  No fever, chills, n/v, cough, rash, dizziness, SOB, chest pain, palpitations, abdominal pain or changes in bowel or bladder habits.  Minimal puffiness in the right foot comes and goes. No redness or edema.  No numbness  or tingling in his extremities.  No falls or syncope reported.  Appetite and hydration are good. Weight is stable at 223 lbs.   ROS: All other 10 point review of systems is negative.   PAST MEDICAL HISTORY:   Past Medical History:  Diagnosis Date   Chronic kidney disease     ALLERGIES: Allergies  Allergen Reactions   Nsaids     Avoids due to low kidney function, tolerates low dose aspirin       MEDICATIONS:  Current Outpatient Medications on File Prior to Visit  Medication Sig Dispense Refill   aspirin EC 81 MG tablet Take 81 mg by mouth daily.     Cholecalciferol (CVS VITAMIN D3) 250 MCG (10000 UT) CAPS Take 10,000 Units by mouth daily.     FARXIGA 10 MG TABS tablet Take 10 mg by mouth daily.     losartan (COZAAR) 50 MG tablet Take 50 mg by mouth daily.     Multiple Vitamin (MULTIVITAMIN) tablet Take 1 tablet by mouth daily.     vitamin k 100 MCG tablet Take 100 mcg by mouth daily.     No current facility-administered medications on file prior to visit.     PAST SURGICAL HISTORY Past Surgical History:  Procedure Laterality Date   BACK SURGERY     SHOULDER ARTHROSCOPY Left    TENDON REPAIR Left 2002  FAMILY HISTORY: Family History  Problem Relation Age of Onset   Heart disease Mother    Heart disease Father    Heart attack Brother     SOCIAL HISTORY:  reports that he has never smoked. He has quit using smokeless tobacco.  His smokeless tobacco use included chew. He reports that he does not drink alcohol and does not use drugs.  PERFORMANCE STATUS: The patient's performance status is 1 - Symptomatic but completely ambulatory  PHYSICAL EXAM: Most Recent Vital Signs: Blood pressure 133/60, pulse 67, temperature 97.7 F (36.5 C), temperature source Oral, resp. rate 18, weight 223 lb (101.2 kg), SpO2 98%. BP 133/60 (BP Location: Right Arm, Patient Position: Sitting, Cuff Size: Large)   Pulse 67   Temp 97.7 F (36.5 C) (Oral)   Resp 18   Wt 223 lb (101.2 kg)    SpO2 98%   BMI 32.00 kg/m   General Appearance:    Alert, cooperative, no distress, appears stated age  Head:    Normocephalic, without obvious abnormality, atraumatic  Eyes:    PERRL, conjunctiva/corneas clear, EOM's intact, fundi    benign, both eyes             Throat:   Lips, mucosa, and tongue normal; teeth and gums normal  Neck:   Supple, symmetrical, trachea midline, no adenopathy;       thyroid:  No enlargement/tenderness/nodules; no carotid   bruit or JVD  Back:     Symmetric, no curvature, ROM normal, no CVA tenderness  Lungs:     Clear to auscultation bilaterally, respirations unlabored  Chest wall:    No tenderness or deformity  Heart:    Regular rate and rhythm, S1 and S2 normal, no murmur, rub   or gallop  Abdomen:     Soft, non-tender, bowel sounds active all four quadrants,    no masses, no organomegaly        Extremities:   Extremities normal, atraumatic, no cyanosis or edema  Pulses:   2+ and symmetric all extremities  Skin:   Skin color, texture, turgor normal, no rashes or lesions  Lymph nodes:   Cervical, supraclavicular, and axillary nodes normal  Neurologic:   CNII-XII intact. Normal strength, sensation and reflexes      throughout    LABORATORY DATA:  Results for orders placed or performed in visit on 12/14/23 (from the past 48 hours)  Reticulocytes     Status: None   Collection Time: 12/14/23  8:10 AM  Result Value Ref Range   Retic Ct Pct 1.1 0.4 - 3.1 %   RBC. 5.67 4.22 - 5.81 MIL/uL   Retic Count, Absolute 64.1 19.0 - 186.0 K/uL   Immature Retic Fract 8.7 2.3 - 15.9 %    Comment: Performed at [facility], [address] [place], [place] [address]  CBC with Differential (Cancer Center Only)     Status: Abnormal   Collection Time: 12/14/23  8:10 AM  Result Value Ref Range   WBC Count 7.2 4.0 - 10.5 K/uL   RBC 5.67 4.22 - 5.81 MIL/uL   Hemoglobin 18.2 (H) 13.0 - 17.0 g/dL   HCT 60.4 (H) 54.0 - 98.1 %   MCV 92.4 80.0 - 100.0 fL    MCH 32.1 26.0 - 34.0 pg   MCHC 34.7 30.0 - 36.0 g/dL   RDW 19.1 47.8 - 29.5 %   Platelet Count 232 150 - 400 K/uL   nRBC 0.0 0.0 - 0.2 %  Neutrophils Relative % 71 %   Neutro Abs 5.0 1.7 - 7.7 K/uL   Lymphocytes Relative 20 %   Lymphs Abs 1.5 0.7 - 4.0 K/uL   Monocytes Relative 6 %   Monocytes Absolute 0.4 0.1 - 1.0 K/uL   Eosinophils Relative 2 %   Eosinophils Absolute 0.2 0.0 - 0.5 K/uL   Basophils Relative 1 %   Basophils Absolute 0.1 0.0 - 0.1 K/uL   Immature Granulocytes 0 %   Abs Immature Granulocytes 0.02 0.00 - 0.07 K/uL    Comment: Performed at [facility], [address] [place], [place] [address]      RADIOGRAPHY: No results found.     PATHOLOGY: None  ASSESSMENT/PLAN: Mr. Kevin [name] is a pleasant 68 yo gentlemen with history of MGUS as well as new erythrocytosis over the last couple months noted by PCP.  Extensive lab work-up is pending.  He has the supplies to do the 24 hr urin and then return to our office.  Follow-up pending. Results.   All questions were answered. The patient knows to call the clinic with any problems, questions or concerns. We can certainly see the patient much sooner if necessary.  The patient was discussed with Dr. Myna Hidalgo and he is in agreement with the aforementioned.   Eileen Stanford, NP

## 2023-12-15 LAB — IGG, IGA, IGM
IgA: 145 mg/dL (ref 61–437)
IgG (Immunoglobin G), Serum: 1267 mg/dL (ref 603–1613)
IgM (Immunoglobulin M), Srm: 26 mg/dL (ref 20–172)

## 2023-12-15 LAB — KAPPA/LAMBDA LIGHT CHAINS
Kappa free light chain: 550.1 mg/L — ABNORMAL HIGH (ref 3.3–19.4)
Kappa, lambda light chain ratio: 39.29 — ABNORMAL HIGH (ref 0.26–1.65)
Lambda free light chains: 14 mg/L (ref 5.7–26.3)

## 2023-12-15 LAB — ERYTHROPOIETIN: Erythropoietin: 12.3 m[IU]/mL (ref 2.6–18.5)

## 2023-12-20 LAB — PROTEIN ELECTROPHORESIS, SERUM
A/G Ratio: 1.2 (ref 0.7–1.7)
Albumin ELP: 3.6 g/dL (ref 2.9–4.4)
Alpha-1-Globulin: 0.2 g/dL (ref 0.0–0.4)
Alpha-2-Globulin: 0.8 g/dL (ref 0.4–1.0)
Beta Globulin: 0.9 g/dL (ref 0.7–1.3)
Gamma Globulin: 1.2 g/dL (ref 0.4–1.8)
Globulin, Total: 3.1 g/dL (ref 2.2–3.9)
M-Spike, %: 0.6 g/dL — ABNORMAL HIGH
Total Protein ELP: 6.7 g/dL (ref 6.0–8.5)

## 2023-12-26 ENCOUNTER — Other Ambulatory Visit: Payer: Self-pay | Admitting: Family

## 2023-12-26 ENCOUNTER — Telehealth: Payer: Self-pay | Admitting: Family

## 2023-12-26 DIAGNOSIS — C9000 Multiple myeloma not having achieved remission: Secondary | ICD-10-CM

## 2023-12-26 NOTE — Telephone Encounter (Signed)
 No answer. Left voicemail with call back number to discuss results and need for bone marrow biopsy.

## 2023-12-27 ENCOUNTER — Telehealth: Payer: Self-pay | Admitting: Family

## 2023-12-27 NOTE — Telephone Encounter (Signed)
 I was able to speak with the patient and go over his protein studies and recommendation for bone marrow biopsy to assess for myeloma. He is in agreement with the plan and will be bringing us  the 24 hr urine specimen as soon as he can. No questions ro concerns at this time. Patient appreciative of call.

## 2024-01-02 LAB — INTELLIGEN MYELOID

## 2024-01-09 ENCOUNTER — Encounter (HOSPITAL_COMMUNITY): Payer: Self-pay | Admitting: Family

## 2024-01-09 ENCOUNTER — Other Ambulatory Visit: Payer: Self-pay | Admitting: Family

## 2024-01-09 ENCOUNTER — Other Ambulatory Visit (HOSPITAL_COMMUNITY): Payer: Self-pay | Admitting: Family

## 2024-01-09 ENCOUNTER — Other Ambulatory Visit: Payer: Self-pay | Admitting: *Deleted

## 2024-01-09 DIAGNOSIS — D472 Monoclonal gammopathy: Secondary | ICD-10-CM

## 2024-01-09 DIAGNOSIS — C9000 Multiple myeloma not having achieved remission: Secondary | ICD-10-CM

## 2024-01-27 ENCOUNTER — Other Ambulatory Visit: Payer: Self-pay | Admitting: Diagnostic Radiology

## 2024-01-27 DIAGNOSIS — Z01818 Encounter for other preprocedural examination: Secondary | ICD-10-CM

## 2024-01-30 NOTE — H&P (Signed)
 Chief Complaint: Monoclonal gammopathy of uncertain significance, erythrocytosis; referred for CT guided bone marrow biopsy to rule out myeloma  Referring Provider(s): Ennever,P  Supervising Physician: Creasie Doctor  Patient Status: [facility] - Out-pt  History of Present Illness: Kevin [name] is a 68 y.o. male with PMH sig for CKD, MGUS and now new erythrocytosis. He presents today for CT guided bone marrow biopsy to rule out multiple myeloma. He is known to IR team from prior BM bx in 2019 and random renal biopsy in 2020.  *** Patient is Full Code  Past Medical History:  Diagnosis Date   Chronic kidney disease     Past Surgical History:  Procedure Laterality Date   BACK SURGERY     SHOULDER ARTHROSCOPY Left    TENDON REPAIR Left 2002    Allergies: Nsaids  Medications: Prior to Admission medications   Medication Sig Start Date End Date Taking? Authorizing Provider  aspirin EC 81 MG tablet Take 81 mg by mouth daily.    [provider]  Cholecalciferol (CVS VITAMIN D3) 250 MCG (10000 UT) CAPS Take 10,000 Units by mouth daily.    [provider]  FARXIGA 10 MG TABS tablet Take 10 mg by mouth daily. 10/21/23   [provider]  losartan (COZAAR) 50 MG tablet Take 50 mg by mouth daily. 10/27/23   [provider]  Multiple Vitamin (MULTIVITAMIN) tablet Take 1 tablet by mouth daily.    [provider]  vitamin k 100 MCG tablet Take 100 mcg by mouth daily.    [provider]     Family History  Problem Relation Age of Onset   Heart disease Mother    Heart disease Father    Heart attack Brother     Social History   Socioeconomic History   Marital status: Single    Spouse name: Not on file   Number of children: Not on file   Years of education: Not on file   Highest education level: Not on file  Occupational History   Not on file  Tobacco Use   Smoking status: Never   Smokeless tobacco: Former    Types: Chew    Tobacco comments:    Only while in the [employer]- still chews nicotine gum  Vaping Use   Vaping status: Never Used  Substance and Sexual Activity   Alcohol use: No   Drug use: No   Sexual activity: Not on file  Other Topics Concern   Not on file  Social History Narrative   Not on file   Social Drivers of Health   Financial Resource Strain: Low Risk  (12/14/2023)   Overall Financial Resource Strain (CARDIA)    Difficulty of Paying Living Expenses: Not hard at all  Food Insecurity: No Food Insecurity (12/14/2023)   Hunger Vital Sign    Worried About Running Out of Food in the Last Year: Never true    Ran Out of Food in the Last Year: Never true  Transportation Needs: No Transportation Needs (12/14/2023)   PRAPARE - Administrator, Civil Service (Medical): No    Lack of Transportation (Non-Medical): No  Physical Activity: Sufficiently Active (12/14/2023)   Exercise Vital Sign    Days of Exercise per Week: 5 days    Minutes of Exercise per Session: 30 min  Stress: No Stress Concern Present (12/14/2023)   [employer] of Occupational Health - Occupational Stress Questionnaire    Feeling of Stress : Not at all  Social Connections: Patient Declined (12/14/2023)   Social Connection and Isolation Panel [NHANES]    Frequency of Communication with Friends and Family: Patient declined    Frequency of Social Gatherings with Friends and Family: Patient declined    Attends Religious Services: Patient declined    Database administrator or Organizations: Patient declined    Attends Banker Meetings: Patient declined    Marital Status: Patient declined      Review of Systems  Vital Signs:   Advance Care Plan: no documents on file  Physical Exam  Imaging: No results found.  Labs:  CBC: Recent Labs    12/14/23 0810  WBC 7.2  HGB 18.2*  HCT 52.4*  PLT 232    COAGS: No results for input(s): "INR", "APTT" in the last 8760 hours.  BMP: Recent Labs     12/14/23 0810  NA 133*  K 4.1  CL 101  CO2 26  GLUCOSE 93  BUN 34*  CALCIUM 9.1  CREATININE 1.69*  GFRNONAA 44*    LIVER FUNCTION TESTS: Recent Labs    12/14/23 0810  BILITOT 0.5  AST 19  ALT 17  ALKPHOS 58  PROT 6.6  ALBUMIN 4.0    TUMOR MARKERS: No results for input(s): "AFPTM", "CEA", "CA199", "CHROMGRNA" in the last 8760 hours.  Assessment and Plan:  68 y.o. male with PMH sig for CKD, MGUS and now new erythrocytosis. He presents today for CT guided bone marrow biopsy to rule out multiple myeloma. He is known to IR team from prior BM bx in 2019 and random renal biopsy in 2020.Risks and benefits of procedure was discussed with the patient  including, but not limited to bleeding, infection, damage to adjacent structures or low yield requiring additional tests.  All of the questions were answered and there is agreement to proceed.  Consent signed and in chart.    Thank you for allowing our service to participate in Kevin [name] 's care.  Electronically Signed: D. Honore Lux, PA-C   01/30/2024, 2:55 PM      I spent a total of  15 minutes   in face to face in clinical consultation, greater than 50% of which was counseling/coordinating care for CT guided bone marrow biopsy

## 2024-01-31 ENCOUNTER — Ambulatory Visit (HOSPITAL_COMMUNITY)
Admission: RE | Admit: 2024-01-31 | Discharge: 2024-01-31 | Disposition: A | Discharge status: Home / Self Care | Source: Ambulatory Visit | Attending: Family | Admitting: Family

## 2024-01-31 ENCOUNTER — Encounter (HOSPITAL_COMMUNITY): Payer: Self-pay

## 2024-01-31 DIAGNOSIS — D472 Monoclonal gammopathy: Secondary | ICD-10-CM | POA: Insufficient documentation

## 2024-01-31 DIAGNOSIS — D751 Secondary polycythemia: Secondary | ICD-10-CM | POA: Diagnosis not present

## 2024-01-31 DIAGNOSIS — M549 Dorsalgia, unspecified: Secondary | ICD-10-CM | POA: Insufficient documentation

## 2024-01-31 DIAGNOSIS — G8929 Other chronic pain: Secondary | ICD-10-CM | POA: Insufficient documentation

## 2024-01-31 DIAGNOSIS — Z01818 Encounter for other preprocedural examination: Secondary | ICD-10-CM

## 2024-01-31 DIAGNOSIS — N189 Chronic kidney disease, unspecified: Secondary | ICD-10-CM | POA: Insufficient documentation

## 2024-01-31 DIAGNOSIS — C9000 Multiple myeloma not having achieved remission: Secondary | ICD-10-CM | POA: Insufficient documentation

## 2024-01-31 DIAGNOSIS — Z862 Personal history of diseases of the blood and blood-forming organs and certain disorders involving the immune mechanism: Secondary | ICD-10-CM | POA: Diagnosis not present

## 2024-01-31 HISTORY — DX: Other complications of anesthesia, initial encounter: T88.59XA

## 2024-01-31 LAB — CBC WITH DIFFERENTIAL/PLATELET
Abs Immature Granulocytes: 0.02 10*3/uL (ref 0.00–0.07)
Basophils Absolute: 0 10*3/uL (ref 0.0–0.1)
Basophils Relative: 0 %
Eosinophils Absolute: 0.2 10*3/uL (ref 0.0–0.5)
Eosinophils Relative: 2 %
HCT: 52.3 % — ABNORMAL HIGH (ref 39.0–52.0)
Hemoglobin: 17.3 g/dL — ABNORMAL HIGH (ref 13.0–17.0)
Immature Granulocytes: 0 %
Lymphocytes Relative: 17 %
Lymphs Abs: 1.7 10*3/uL (ref 0.7–4.0)
MCH: 32 pg (ref 26.0–34.0)
MCHC: 33.1 g/dL (ref 30.0–36.0)
MCV: 96.9 fL (ref 80.0–100.0)
Monocytes Absolute: 0.6 10*3/uL (ref 0.1–1.0)
Monocytes Relative: 6 %
Neutro Abs: 7.4 10*3/uL (ref 1.7–7.7)
Neutrophils Relative %: 75 %
Platelets: 305 10*3/uL (ref 150–400)
RBC: 5.4 MIL/uL (ref 4.22–5.81)
RDW: 13.3 % (ref 11.5–15.5)
WBC: 9.9 10*3/uL (ref 4.0–10.5)
nRBC: 0 % (ref 0.0–0.2)

## 2024-01-31 LAB — PROTIME-INR
INR: 0.9 (ref 0.8–1.2)
Prothrombin Time: 12 s (ref 11.4–15.2)

## 2024-01-31 MED ORDER — FENTANYL CITRATE (PF) 100 MCG/2ML IJ SOLN
INTRAMUSCULAR | Status: AC
  Filled 2024-01-31: qty 2

## 2024-01-31 MED ORDER — SODIUM CHLORIDE 0.9 % IV SOLN: INTRAVENOUS | Status: DC

## 2024-01-31 MED ORDER — MIDAZOLAM HCL 2 MG/2ML IJ SOLN
INTRAMUSCULAR | Status: AC
  Filled 2024-01-31: qty 4

## 2024-01-31 NOTE — Sedation Documentation (Signed)
 Pt wants to proceed without sedation.

## 2024-01-31 NOTE — Discharge Instructions (Addendum)
Bone Marrow Aspiration and Bone Marrow Biopsy, Adult, Care After  The following information offers guidance on how to care for yourself after your procedure. Your health care provider may also give you more specific instructions. If you have problems or questions, contact your health care provider.  What can I expect after the procedure?  May remove dressing or bandaid and shower tomorrow.  Keep site clean and dry. Replace with clean dressing or bandaid as necessary. Urgent needs IR clinic [phone] (mon-fri 8-5).  After the procedure, it is common to have: Mild pain and tenderness. Swelling. Bruising. Follow these instructions at home: Incision care  Follow instructions from your health care provider about how to take care of the incision site. Make sure you: Wash your hands with soap and water for at least 20 seconds before and after you change your bandage (dressing). If soap and water are not available, use hand sanitizer. Change your dressing as told by your health care provider. Leave stitches (sutures), skin glue, or adhesive strips in place. These skin closures may need to stay in place for 2 weeks or longer. If adhesive strip edges start to loosen and curl up, you may trim the loose edges. Do not remove adhesive strips completely unless your health care provider tells you to do that. Check your incision site every day for signs of infection. Check for: More redness, swelling, or pain. Fluid or blood. Warmth. Pus or a bad smell. Activity Return to your normal activities as told by your health care provider. Ask your health care provider what activities are safe for you. Do not lift anything that is heavier than 10 lb (4.5 kg), or the limit that you are told, until your health care provider says that it is safe. If you were given a sedative during the procedure, it can affect you for  several hours. Do not drive or operate machinery until your health care provider says that it is safe. General instructions  Take over-the-counter and prescription medicines only as told by your health care provider. Do not take baths, swim, or use a hot tub until your health care provider approves. Ask your health care provider if you may take showers. You may only be allowed to take sponge baths. If directed, put ice on the affected area. To do this: Put ice in a plastic bag. Place a towel between your skin and the bag. Leave the ice on for 20 minutes, 2-3 times a day. If your skin turns bright red, remove the ice right away to prevent skin damage. The risk of skin damage is higher if you cannot feel pain, heat, or cold. Contact a health care provider if: You have signs of infection. Your pain is not controlled with medicine. You have cancer, and a temperature of 100.68F (38C) or higher. Get help right away if: You have a temperature of 101F (38.3C) or higher, or as told by your health care provider. You have bleeding from the incision site that cannot be controlled. This information is not intended to replace advice given to you by your health care provider. Make sure you discuss any questions you have with your health care provider. Document Revised: 12/28/2021 Document Reviewed: 12/28/2021 Elsevier Patient Education  [place].

## 2024-02-02 LAB — SURGICAL PATHOLOGY

## 2024-02-06 ENCOUNTER — Encounter: Payer: Self-pay | Admitting: *Deleted

## 2024-02-06 ENCOUNTER — Encounter: Payer: Self-pay | Admitting: Family

## 2024-02-06 DIAGNOSIS — R809 Proteinuria, unspecified: Secondary | ICD-10-CM | POA: Diagnosis not present

## 2024-02-06 DIAGNOSIS — D582 Other hemoglobinopathies: Secondary | ICD-10-CM | POA: Diagnosis not present

## 2024-02-06 DIAGNOSIS — N1831 Chronic kidney disease, stage 3a: Secondary | ICD-10-CM | POA: Diagnosis not present

## 2024-02-09 ENCOUNTER — Inpatient Hospital Stay: Attending: Hematology & Oncology

## 2024-02-09 ENCOUNTER — Inpatient Hospital Stay (HOSPITAL_BASED_OUTPATIENT_CLINIC_OR_DEPARTMENT_OTHER): Admitting: Hematology & Oncology

## 2024-02-09 ENCOUNTER — Encounter: Payer: Self-pay | Admitting: *Deleted

## 2024-02-09 ENCOUNTER — Encounter: Payer: Self-pay | Admitting: Hematology & Oncology

## 2024-02-09 ENCOUNTER — Encounter: Payer: Self-pay | Admitting: Family

## 2024-02-09 VITALS — BP 134/83 | HR 65 | Temp 97.5°F | Resp 20 | Ht 70.0 in | Wt 215.1 lb

## 2024-02-09 DIAGNOSIS — C9000 Multiple myeloma not having achieved remission: Secondary | ICD-10-CM | POA: Insufficient documentation

## 2024-02-09 DIAGNOSIS — Z5112 Encounter for antineoplastic immunotherapy: Secondary | ICD-10-CM | POA: Diagnosis present

## 2024-02-09 DIAGNOSIS — Z79899 Other long term (current) drug therapy: Secondary | ICD-10-CM | POA: Insufficient documentation

## 2024-02-09 DIAGNOSIS — D472 Monoclonal gammopathy: Secondary | ICD-10-CM

## 2024-02-09 DIAGNOSIS — Z7982 Long term (current) use of aspirin: Secondary | ICD-10-CM | POA: Insufficient documentation

## 2024-02-09 LAB — CMP (CANCER CENTER ONLY)
ALT: 26 U/L (ref 0–44)
AST: 25 U/L (ref 15–41)
Albumin: 4.1 g/dL (ref 3.5–5.0)
Alkaline Phosphatase: 46 U/L (ref 38–126)
Anion gap: 7 (ref 5–15)
BUN: 34 mg/dL — ABNORMAL HIGH (ref 8–23)
CO2: 28 mmol/L (ref 22–32)
Calcium: 9.4 mg/dL (ref 8.9–10.3)
Chloride: 101 mmol/L (ref 98–111)
Creatinine: 1.97 mg/dL — ABNORMAL HIGH (ref 0.61–1.24)
GFR, Estimated: 36 mL/min — ABNORMAL LOW (ref >60)
Glucose, Bld: 94 mg/dL (ref 70–99)
Potassium: 4.3 mmol/L (ref 3.5–5.1)
Sodium: 136 mmol/L (ref 135–145)
Total Bilirubin: 0.5 mg/dL (ref 0.0–1.2)
Total Protein: 7 g/dL (ref 6.5–8.1)

## 2024-02-09 LAB — CBC WITH DIFFERENTIAL (CANCER CENTER ONLY)
Abs Immature Granulocytes: 0.04 10*3/uL (ref 0.00–0.07)
Basophils Absolute: 0 10*3/uL (ref 0.0–0.1)
Basophils Relative: 1 %
Eosinophils Absolute: 0.1 10*3/uL (ref 0.0–0.5)
Eosinophils Relative: 1 %
HCT: 48.4 % (ref 39.0–52.0)
Hemoglobin: 16.4 g/dL (ref 13.0–17.0)
Immature Granulocytes: 1 %
Lymphocytes Relative: 29 %
Lymphs Abs: 1.7 10*3/uL (ref 0.7–4.0)
MCH: 31.7 pg (ref 26.0–34.0)
MCHC: 33.9 g/dL (ref 30.0–36.0)
MCV: 93.4 fL (ref 80.0–100.0)
Monocytes Absolute: 0.5 10*3/uL (ref 0.1–1.0)
Monocytes Relative: 8 %
Neutro Abs: 3.5 10*3/uL (ref 1.7–7.7)
Neutrophils Relative %: 60 %
Platelet Count: 296 10*3/uL (ref 150–400)
RBC: 5.18 MIL/uL (ref 4.22–5.81)
RDW: 13.2 % (ref 11.5–15.5)
WBC Count: 5.9 10*3/uL (ref 4.0–10.5)
nRBC: 0 % (ref 0.0–0.2)

## 2024-02-09 LAB — SAVE SMEAR(SSMR), FOR PROVIDER SLIDE REVIEW

## 2024-02-09 NOTE — Progress Notes (Unsigned)
  Hematology and Oncology Follow Up Visit  Kevin [name] [id number] 1955/11/22 68 y.o. 02/09/2024   Principle Diagnosis:  ***  Current Therapy:   ***     Interim History:  Kevin [name] is ***  Medications:  Current Outpatient Medications:    aspirin EC 81 MG tablet, Take 81 mg by mouth daily., Disp: , Rfl:    Cholecalciferol (CVS VITAMIN D3) 250 MCG (10000 UT) CAPS, Take 10,000 Units by mouth daily., Disp: , Rfl:    FARXIGA 10 MG TABS tablet, Take 10 mg by mouth daily., Disp: , Rfl:    losartan (COZAAR) 50 MG tablet, Take 50 mg by mouth daily., Disp: , Rfl:    Multiple Vitamin (MULTIVITAMIN) tablet, Take 1 tablet by mouth daily., Disp: , Rfl:    Omega-3 Fatty Acids (FISH OIL) 500 MG CAPS, Take by mouth daily., Disp: , Rfl:    Turmeric (QC TUMERIC COMPLEX PO), Take 1,000 mg by mouth., Disp: , Rfl:    vitamin k 100 MCG tablet, Take 100 mcg by mouth daily., Disp: , Rfl:    Wheat Dextrin (BENEFIBER PO), Take by mouth daily., Disp: , Rfl:   Allergies:  Allergies  Allergen Reactions   Nsaids     Avoids due to low kidney function, tolerates low dose aspirin     Past Medical History, Surgical history, Social history, and Family History were reviewed and updated.  Review of Systems: Review of Systems - Oncology  Physical Exam:  height is 5\' 10"  (1.778 m) and weight is 215 lb 1.9 oz (97.6 kg). His oral temperature is 97.5 F (36.4 C) (abnormal). His blood pressure is 134/83 and his pulse is 65. His respiration is 20 and oxygen saturation is 100%.   Wt Readings from Last 3 Encounters:  02/09/24 215 lb 1.9 oz (97.6 kg)  12/14/23 223 lb (101.2 kg)  10/10/18 226 lb (102.5 kg)    Physical Exam   Lab Results  Component Value Date   WBC 5.9 02/09/2024   HGB 16.4 02/09/2024   HCT 48.4 02/09/2024   MCV 93.4 02/09/2024   PLT 296 02/09/2024     Chemistry      Component Value Date/Time   NA 136 02/09/2024 1035   K 4.3 02/09/2024 1035   CL 101 02/09/2024 1035   CO2 28  02/09/2024 1035   BUN 34 (H) 02/09/2024 1035   CREATININE 1.97 (H) 02/09/2024 1035      Component Value Date/Time   CALCIUM 9.4 02/09/2024 1035   ALKPHOS 46 02/09/2024 1035   AST 25 02/09/2024 1035   ALT 26 02/09/2024 1035   BILITOT 0.5 02/09/2024 1035         Impression and Plan: Kevin [name] is ***   Ivor Mars, MD 6/5/20255:22 PM

## 2024-02-09 NOTE — Progress Notes (Signed)
 Received message that this patient is undergoing work up for likely Multiple Myeloma. 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 Once chemo order placed will reach out to patient.

## 2024-02-10 ENCOUNTER — Encounter: Payer: Self-pay | Admitting: Hematology & Oncology

## 2024-02-10 ENCOUNTER — Encounter (HOSPITAL_COMMUNITY): Payer: Self-pay | Admitting: Family

## 2024-02-10 LAB — IGG, IGA, IGM
IgA: 134 mg/dL (ref 61–437)
IgG (Immunoglobin G), Serum: 1187 mg/dL (ref 603–1613)
IgM (Immunoglobulin M), Srm: 29 mg/dL (ref 20–172)

## 2024-02-10 LAB — KAPPA/LAMBDA LIGHT CHAINS
Kappa free light chain: 626.8 mg/L — ABNORMAL HIGH (ref 3.3–19.4)
Kappa, lambda light chain ratio: 43.83 — ABNORMAL HIGH (ref 0.26–1.65)
Lambda free light chains: 14.3 mg/L (ref 5.7–26.3)

## 2024-02-10 NOTE — Progress Notes (Signed)
 START ON PATHWAY REGIMEN - Multiple Myeloma and Other Plasma Cell Dyscrasias     Cycles 1 and 2: A cycle is every 28 days:     Daratumumab and hyaluronidase-fihj    Cycles 3 through 6: A cycle is every 28 days:     Daratumumab and hyaluronidase-fihj    Cycles 7 and beyond: A cycle is every 28 days:     Daratumumab and hyaluronidase-fihj   **Always confirm dose/schedule in your pharmacy ordering system**  Patient Characteristics: Smoldering Myeloma, High Risk Disease Classification: Smoldering Myeloma Risk Status: High Risk Intent of Therapy: Curative Intent, Discussed with Patient

## 2024-02-12 ENCOUNTER — Other Ambulatory Visit: Payer: Self-pay

## 2024-02-13 ENCOUNTER — Encounter (HOSPITAL_COMMUNITY): Payer: Self-pay

## 2024-02-14 ENCOUNTER — Inpatient Hospital Stay

## 2024-02-14 ENCOUNTER — Encounter: Payer: Self-pay | Admitting: Hematology & Oncology

## 2024-02-14 ENCOUNTER — Ambulatory Visit: Payer: Self-pay | Admitting: Hematology & Oncology

## 2024-02-14 ENCOUNTER — Encounter: Payer: Self-pay | Admitting: *Deleted

## 2024-02-14 ENCOUNTER — Other Ambulatory Visit: Payer: Self-pay | Admitting: *Deleted

## 2024-02-14 DIAGNOSIS — D472 Monoclonal gammopathy: Secondary | ICD-10-CM

## 2024-02-14 LAB — UPEP/UIFE/LIGHT CHAINS/TP, 24-HR UR
% BETA, Urine: 49.3 %
ALPHA 1 URINE: 1.5 %
Albumin, U: 41.7 %
Alpha 2, Urine: 6 %
Free Kappa Lt Chains,Ur: 3208.59 mg/L — ABNORMAL HIGH (ref 1.17–86.46)
Free Kappa/Lambda Ratio: 385.18 — ABNORMAL HIGH (ref 1.83–14.26)
Free Lambda Lt Chains,Ur: 8.33 mg/L (ref 0.27–15.21)
GAMMA GLOBULIN URINE: 1.4 %
M-SPIKE %, Urine: 43.5 % — ABNORMAL HIGH
M-Spike, Mg/24 Hr: 722 mg/(24.h) — ABNORMAL HIGH
Total Protein, Urine-Ur/day: 1659 mg/(24.h) — ABNORMAL HIGH (ref 30–150)
Total Protein, Urine: 55.3 mg/dL
Total Volume: 3000

## 2024-02-14 MED ORDER — ONDANSETRON HCL 8 MG PO TABS
8.0000 mg | ORAL_TABLET | Freq: Three times a day (TID) | ORAL | 1 refills | Status: AC | PRN
Start: 2024-02-14 — End: ?

## 2024-02-14 NOTE — Progress Notes (Signed)
 Patient will receive monotherapy with SQ Daratumumab. He will not need port. He will need chemo education.   Called patient and scheduled Chemo Education for this afternoon over phone call. Social work and nutrition consult placed per protocol, however will ask to contact next week as patient is currently out of town.   Oncology Nurse Navigator Documentation     02/14/2024    9:00 AM  Oncology Nurse Navigator Flowsheets  Confirmed Diagnosis Date 01/31/2024  Phase of Treatment Chemotherapy  Navigator Follow Up Date: 02/17/2024  Navigator Follow Up Reason: Chemotherapy  Navigator Location [facility]  Navigator Encounter Type Introductory Phone Call;Telephone  Telephone Outgoing Call  Patient Visit Type MedOnc  Treatment Phase Pre-Tx/Tx Discussion  Barriers/Navigation Needs Coordination of Care;Education  Education Other  Interventions Coordination of Care;Education;Referrals  Acuity Level 2-Minimal Needs (1-2 Barriers Identified)  Referrals Nutrition/dietician;Social Work  Coordination of Care Appts  Education Method Verbal  Time Spent with Patient 15  Genetic Counseling Type None

## 2024-02-14 NOTE — Progress Notes (Signed)
 Patient called for Immunotherapy education.  Discussed side effects of Darzalex which include but are not limited to myelosuppression, decreased appetite, fatigue, fever, allergic or infusional reaction,  myalgia and arthralgias,, increased risk of infections. Reviewed reasons for premedications and why it is necessary to take these.  Patient hesitant to take these.  Reviewed schedule for patient and how he would need to stay for observation for 2 hours.    Reviewed infusion room and office policy and procedure and phone numbers 24 hours x 7 days a week.   Reviewed when to call the office with any concerns or problems.  Transport planner given.    Antiemetic protocol and immunotherapy schedule reviewed. Patient verbalized understanding of immunotherapy indications and possible side effects.  Teachback done

## 2024-02-15 ENCOUNTER — Encounter: Payer: Self-pay | Admitting: *Deleted

## 2024-02-15 LAB — IMMUNOFIXATION REFLEX, SERUM
IgA: 140 mg/dL (ref 61–437)
IgG (Immunoglobin G), Serum: 1338 mg/dL (ref 603–1613)
IgM (Immunoglobulin M), Srm: 27 mg/dL (ref 20–172)

## 2024-02-15 LAB — PROTEIN ELECTROPHORESIS, SERUM, WITH REFLEX
A/G Ratio: 1 (ref 0.7–1.7)
Albumin ELP: 3.4 g/dL (ref 2.9–4.4)
Alpha-1-Globulin: 0.2 g/dL (ref 0.0–0.4)
Alpha-2-Globulin: 1 g/dL (ref 0.4–1.0)
Beta Globulin: 1 g/dL (ref 0.7–1.3)
Gamma Globulin: 1.1 g/dL (ref 0.4–1.8)
Globulin, Total: 3.3 g/dL (ref 2.2–3.9)
M-Spike, %: 0.6 g/dL — ABNORMAL HIGH
SPEP Interpretation: 0
Total Protein ELP: 6.7 g/dL (ref 6.0–8.5)

## 2024-02-17 ENCOUNTER — Inpatient Hospital Stay

## 2024-02-20 ENCOUNTER — Telehealth: Payer: Self-pay

## 2024-02-20 ENCOUNTER — Telehealth: Payer: Self-pay | Admitting: *Deleted

## 2024-02-20 NOTE — Telephone Encounter (Signed)
 Patient called saying that someone from here is calling him but does not know why.  Looked in the computer and it was for Child psychotherapist.  He asked why he is having it for.  She wanted to touch base with him and see how he is doing.  He says he is fine he does not need that person to call him

## 2024-02-20 NOTE — Telephone Encounter (Signed)
 Clinical Social Work was referred by medical provider for assessment of psychosocial needs.  CSW attempted to contact patient by phone.  Left voicemail with contact information and request for return call.

## 2024-02-21 ENCOUNTER — Inpatient Hospital Stay

## 2024-02-21 VITALS — BP 136/83 | HR 72 | Temp 97.9°F | Resp 18

## 2024-02-21 DIAGNOSIS — Z5112 Encounter for antineoplastic immunotherapy: Secondary | ICD-10-CM | POA: Diagnosis not present

## 2024-02-21 DIAGNOSIS — D472 Monoclonal gammopathy: Secondary | ICD-10-CM

## 2024-02-21 LAB — CBC WITH DIFFERENTIAL (CANCER CENTER ONLY)
Abs Immature Granulocytes: 0.03 10*3/uL (ref 0.00–0.07)
Basophils Absolute: 0 10*3/uL (ref 0.0–0.1)
Basophils Relative: 1 %
Eosinophils Absolute: 0.3 10*3/uL (ref 0.0–0.5)
Eosinophils Relative: 4 %
HCT: 48 % (ref 39.0–52.0)
Hemoglobin: 16.4 g/dL (ref 13.0–17.0)
Immature Granulocytes: 0 %
Lymphocytes Relative: 20 %
Lymphs Abs: 1.5 10*3/uL (ref 0.7–4.0)
MCH: 31.8 pg (ref 26.0–34.0)
MCHC: 34.2 g/dL (ref 30.0–36.0)
MCV: 93 fL (ref 80.0–100.0)
Monocytes Absolute: 0.5 10*3/uL (ref 0.1–1.0)
Monocytes Relative: 7 %
Neutro Abs: 5.2 10*3/uL (ref 1.7–7.7)
Neutrophils Relative %: 68 %
Platelet Count: 272 10*3/uL (ref 150–400)
RBC: 5.16 MIL/uL (ref 4.22–5.81)
RDW: 13.2 % (ref 11.5–15.5)
WBC Count: 7.5 10*3/uL (ref 4.0–10.5)
nRBC: 0 % (ref 0.0–0.2)

## 2024-02-21 LAB — COMPREHENSIVE METABOLIC PANEL WITH GFR
ALT: 25 U/L (ref 0–44)
AST: 22 U/L (ref 15–41)
Albumin: 4 g/dL (ref 3.5–5.0)
Alkaline Phosphatase: 51 U/L (ref 38–126)
Anion gap: 9 (ref 5–15)
BUN: 38 mg/dL — ABNORMAL HIGH (ref 8–23)
CO2: 29 mmol/L (ref 22–32)
Calcium: 9.3 mg/dL (ref 8.9–10.3)
Chloride: 99 mmol/L (ref 98–111)
Creatinine, Ser: 1.89 mg/dL — ABNORMAL HIGH (ref 0.61–1.24)
GFR, Estimated: 38 mL/min — ABNORMAL LOW (ref >60)
Glucose, Bld: 88 mg/dL (ref 70–99)
Potassium: 4.1 mmol/L (ref 3.5–5.1)
Sodium: 137 mmol/L (ref 135–145)
Total Bilirubin: 0.4 mg/dL (ref 0.0–1.2)
Total Protein: 6.6 g/dL (ref 6.5–8.1)

## 2024-02-21 LAB — TYPE AND SCREEN
ABO/RH(D): O POS
Antibody Screen: NEGATIVE

## 2024-02-21 MED ORDER — MONTELUKAST SODIUM 10 MG PO TABS
10.0000 mg | ORAL_TABLET | Freq: Once | ORAL | Status: AC
  Administered 2024-02-21: 10 mg via ORAL
  Filled 2024-02-21: qty 1

## 2024-02-21 MED ORDER — DIPHENHYDRAMINE HCL 25 MG PO CAPS: 50.0000 mg | ORAL_CAPSULE | Freq: Once | ORAL | Status: DC

## 2024-02-21 MED ORDER — DEXAMETHASONE 4 MG PO TABS
20.0000 mg | ORAL_TABLET | Freq: Once | ORAL | Status: AC
  Administered 2024-02-21: 20 mg via ORAL
  Filled 2024-02-21: qty 5

## 2024-02-21 MED ORDER — DEXAMETHASONE 4 MG PO TABS: ORAL_TABLET | ORAL | 3 refills | Status: AC

## 2024-02-21 MED ORDER — ACETAMINOPHEN 325 MG PO TABS: 650.0000 mg | ORAL_TABLET | Freq: Once | ORAL | Status: DC

## 2024-02-21 MED ORDER — DARATUMUMAB-HYALURONIDASE-FIHJ 1800-30000 MG-UT/15ML ~~LOC~~ SOLN
1800.0000 mg | Freq: Once | SUBCUTANEOUS | Status: AC
  Administered 2024-02-21: 1800 mg via SUBCUTANEOUS
  Filled 2024-02-21: qty 15

## 2024-02-21 NOTE — Progress Notes (Signed)
 OK to treat with creatinine 1.89 per Dr. Maria Shiner.   Patient refuses tylenol and benadryl. This RN explained the significance of Tylenol and Benadryl to help prevent reaction to Darzalex. Patient verbalized understanding.

## 2024-02-21 NOTE — Patient Instructions (Signed)
 [facility]  Discharge Instructions: Thank you for choosing [facility] to provide your oncology and hematology care.   If you have a lab appointment with the Cancer Center, please go directly to the Cancer Center and check in at the registration area.  Wear comfortable clothing and clothing appropriate for easy access to any Portacath or PICC line.   We strive to give you quality time with your provider. You may need to reschedule your appointment if you arrive late (15 or more minutes).  Arriving late affects you and other patients whose appointments are after yours.  Also, if you miss three or more appointments without notifying the office, you may be dismissed from the clinic at the provider's discretion.      For prescription refill requests, have your pharmacy contact our office and allow 72 hours for refills to be completed.    Today you received the following chemotherapy and/or immunotherapy agents Darzalex.      To help prevent nausea and vomiting after your treatment, we encourage you to take your nausea medication as directed.  BELOW ARE SYMPTOMS THAT SHOULD BE REPORTED IMMEDIATELY: *FEVER GREATER THAN 100.4 F (38 C) OR HIGHER *CHILLS OR SWEATING *NAUSEA AND VOMITING THAT IS NOT CONTROLLED WITH YOUR NAUSEA MEDICATION *UNUSUAL SHORTNESS OF BREATH *UNUSUAL BRUISING OR BLEEDING *URINARY PROBLEMS (pain or burning when urinating, or frequent urination) *BOWEL PROBLEMS (unusual diarrhea, constipation, pain near the anus) TENDERNESS IN MOUTH AND THROAT WITH OR WITHOUT PRESENCE OF ULCERS (sore throat, sores in mouth, or a toothache) UNUSUAL RASH, SWELLING OR PAIN  UNUSUAL VAGINAL DISCHARGE OR ITCHING   Items with * indicate a potential emergency and should be followed up as soon as possible or go to the Emergency Department if any problems should occur.  Please show the CHEMOTHERAPY ALERT CARD or IMMUNOTHERAPY  ALERT CARD at check-in to the Emergency Department and triage nurse. Should you have questions after your visit or need to cancel or reschedule your appointment, please contact [facility] - A DEPT OF [facility]ideg[facility]  [phone] and follow the prompts.  Office hours are 8:00 a.m. to 4:30 p.m. Monday - Friday. Please note that voicemails left after 4:00 p.m. may not be returned until the following business day.  We are closed weekends and major holidays. You have access to a nurse at all times for urgent questions. Please call the main number to the clinic [phone] and follow the prompts.  For any non-urgent questions, you may also contact your provider using MyChart. We now offer e-Visits for anyone 65 and older to request care online for non-urgent symptoms. For details visit [email or web address].   Also download the MyChart app! Go to the app store, search "MyChart", open the app, select [place], and log in with your MyChart username and password.

## 2024-02-22 ENCOUNTER — Other Ambulatory Visit: Payer: Self-pay

## 2024-02-22 ENCOUNTER — Inpatient Hospital Stay: Admitting: Dietician

## 2024-02-22 LAB — PRETREATMENT RBC PHENOTYPE

## 2024-02-22 NOTE — Progress Notes (Signed)
 Nutrition Assessment: Reached out to patient at home telephone number.    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  Patient very well aware of his CKD and informed with his renal function and labs.  He does follow a low carb and high protein diet and is intentionally trying to lose some fat weight.  He has no food allergies and denies and NIS at this time.  Usual intake: Premier protein for breakfast or leftovers Chicken fish pork broccoli cauliflower (raw), Romaine salad (not much fruit, like it but avoid for carbs)  He hydrates well with  8-10 bottles water, no milk, no alcohol, Premier protein BID as meal replacement  Nutrition Focused Physical Exam: unable to perform NFPE   Medications: MVI, Vit D, Vit K   Labs: 02/21/24  BUN 38, Create 1.89, GFR 38   Anthropometrics: 8# (3.6%) weight loss past 2 months  Height: 70" Weight: 215# UBW: 220# DBW:  205# BMI: 30.87 (per MD note very fit, works out 4 times a week, former marine) intentional weight loss   Estimated Energy Needs  Kcals: 2500 (for safe weight loss) Protein: 78-98 g Fluid: 3 L   NUTRITION DIAGNOSIS: Food and Nutrition Related Knowledge Deficit related to cancer and associated treatments as evidenced by no prior need for nutrition related information.    INTERVENTION:   Relayed that nutrition services are wrap around service provided at no charge and encouraged continued communication if experiencing any nutritional impact symptoms (NIS). Encouraged increasing calcium intake, offered low carb milk alternatives as suggestions (almond milk, Fairlife) Discussed protein ranges for intake at meals with goal 30-40g/meal Cautioned against use of  ONS in addition to high protein choices at a meal but endorsed use as meal replacement Contact information provided to cell  MONITORING, EVALUATION, GOAL: weight trends, nutrition impact symptoms, PO intake, labs   Next Visit: PRN at patient or provider request.  Carleen Chary, RDN, LDN

## 2024-02-24 ENCOUNTER — Inpatient Hospital Stay

## 2024-02-28 ENCOUNTER — Other Ambulatory Visit: Payer: Self-pay

## 2024-02-28 ENCOUNTER — Encounter: Payer: Self-pay | Admitting: *Deleted

## 2024-02-28 ENCOUNTER — Inpatient Hospital Stay: Admitting: Hematology & Oncology

## 2024-02-28 ENCOUNTER — Inpatient Hospital Stay

## 2024-02-28 ENCOUNTER — Encounter: Payer: Self-pay | Admitting: Hematology & Oncology

## 2024-02-28 VITALS — BP 140/88 | HR 72 | Temp 97.5°F | Resp 18

## 2024-02-28 DIAGNOSIS — D472 Monoclonal gammopathy: Secondary | ICD-10-CM

## 2024-02-28 DIAGNOSIS — Z5112 Encounter for antineoplastic immunotherapy: Secondary | ICD-10-CM | POA: Diagnosis not present

## 2024-02-28 LAB — CBC WITH DIFFERENTIAL (CANCER CENTER ONLY)
Abs Immature Granulocytes: 0.02 10*3/uL (ref 0.00–0.07)
Basophils Absolute: 0.1 10*3/uL (ref 0.0–0.1)
Basophils Relative: 1 %
Eosinophils Absolute: 0.1 10*3/uL (ref 0.0–0.5)
Eosinophils Relative: 1 %
HCT: 47.2 % (ref 39.0–52.0)
Hemoglobin: 16.4 g/dL (ref 13.0–17.0)
Immature Granulocytes: 0 %
Lymphocytes Relative: 16 %
Lymphs Abs: 1.2 10*3/uL (ref 0.7–4.0)
MCH: 32.4 pg (ref 26.0–34.0)
MCHC: 34.7 g/dL (ref 30.0–36.0)
MCV: 93.3 fL (ref 80.0–100.0)
Monocytes Absolute: 0.6 10*3/uL (ref 0.1–1.0)
Monocytes Relative: 7 %
Neutro Abs: 5.8 10*3/uL (ref 1.7–7.7)
Neutrophils Relative %: 75 %
Platelet Count: 247 10*3/uL (ref 150–400)
RBC: 5.06 MIL/uL (ref 4.22–5.81)
RDW: 13.5 % (ref 11.5–15.5)
WBC Count: 7.8 10*3/uL (ref 4.0–10.5)
nRBC: 0 % (ref 0.0–0.2)

## 2024-02-28 LAB — CMP (CANCER CENTER ONLY)
ALT: 21 U/L (ref 0–44)
AST: 26 U/L (ref 15–41)
Albumin: 3.9 g/dL (ref 3.5–5.0)
Alkaline Phosphatase: 47 U/L (ref 38–126)
Anion gap: 7 (ref 5–15)
BUN: 28 mg/dL — ABNORMAL HIGH (ref 8–23)
CO2: 26 mmol/L (ref 22–32)
Calcium: 9.2 mg/dL (ref 8.9–10.3)
Chloride: 103 mmol/L (ref 98–111)
Creatinine: 1.86 mg/dL — ABNORMAL HIGH (ref 0.61–1.24)
GFR, Estimated: 39 mL/min — ABNORMAL LOW (ref >60)
Glucose, Bld: 95 mg/dL (ref 70–99)
Potassium: 4.1 mmol/L (ref 3.5–5.1)
Sodium: 136 mmol/L (ref 135–145)
Total Bilirubin: 0.4 mg/dL (ref 0.0–1.2)
Total Protein: 6.1 g/dL — ABNORMAL LOW (ref 6.5–8.1)

## 2024-02-28 LAB — LACTATE DEHYDROGENASE: LDH: 169 U/L (ref 98–192)

## 2024-02-28 MED ORDER — DIPHENHYDRAMINE HCL 25 MG PO CAPS: 50.0000 mg | ORAL_CAPSULE | Freq: Once | ORAL | Status: DC

## 2024-02-28 MED ORDER — ACETAMINOPHEN 325 MG PO TABS: 650.0000 mg | ORAL_TABLET | Freq: Once | ORAL | Status: DC

## 2024-02-28 MED ORDER — MONTELUKAST SODIUM 10 MG PO TABS
10.0000 mg | ORAL_TABLET | Freq: Once | ORAL | Status: AC
  Administered 2024-02-28: 10 mg via ORAL
  Filled 2024-02-28: qty 1

## 2024-02-28 MED ORDER — DEXAMETHASONE 4 MG PO TABS
20.0000 mg | ORAL_TABLET | Freq: Once | ORAL | Status: DC
Start: 2024-02-28 — End: 2024-02-28

## 2024-02-28 MED ORDER — DARATUMUMAB-HYALURONIDASE-FIHJ 1800-30000 MG-UT/15ML ~~LOC~~ SOLN
1800.0000 mg | Freq: Once | SUBCUTANEOUS | Status: AC
  Administered 2024-02-28: 1800 mg via SUBCUTANEOUS
  Filled 2024-02-28: qty 15

## 2024-02-28 NOTE — Patient Instructions (Signed)
 [facility]  Discharge Instructions: Thank you for choosing [facility] to provide your oncology and hematology care.   If you have a lab appointment with the Cancer Center, please go directly to the Cancer Center and check in at the registration area.  Wear comfortable clothing and clothing appropriate for easy access to any Portacath or PICC line.   We strive to give you quality time with your provider. You may need to reschedule your appointment if you arrive late (15 or more minutes).  Arriving late affects you and other patients whose appointments are after yours.  Also, if you miss three or more appointments without notifying the office, you may be dismissed from the clinic at the provider's discretion.      For prescription refill requests, have your pharmacy contact our office and allow 72 hours for refills to be completed.    Today you received the following chemotherapy and/or immunotherapy agents Darzalex.      To help prevent nausea and vomiting after your treatment, we encourage you to take your nausea medication as directed.  BELOW ARE SYMPTOMS THAT SHOULD BE REPORTED IMMEDIATELY: *FEVER GREATER THAN 100.4 F (38 C) OR HIGHER *CHILLS OR SWEATING *NAUSEA AND VOMITING THAT IS NOT CONTROLLED WITH YOUR NAUSEA MEDICATION *UNUSUAL SHORTNESS OF BREATH *UNUSUAL BRUISING OR BLEEDING *URINARY PROBLEMS (pain or burning when urinating, or frequent urination) *BOWEL PROBLEMS (unusual diarrhea, constipation, pain near the anus) TENDERNESS IN MOUTH AND THROAT WITH OR WITHOUT PRESENCE OF ULCERS (sore throat, sores in mouth, or a toothache) UNUSUAL RASH, SWELLING OR PAIN  UNUSUAL VAGINAL DISCHARGE OR ITCHING   Items with * indicate a potential emergency and should be followed up as soon as possible or go to the Emergency Department if any problems should occur.  Please show the CHEMOTHERAPY ALERT CARD or IMMUNOTHERAPY  ALERT CARD at check-in to the Emergency Department and triage nurse. Should you have questions after your visit or need to cancel or reschedule your appointment, please contact [facility] - A DEPT OF [facility]ideg[facility]  [phone] and follow the prompts.  Office hours are 8:00 a.m. to 4:30 p.m. Monday - Friday. Please note that voicemails left after 4:00 p.m. may not be returned until the following business day.  We are closed weekends and major holidays. You have access to a nurse at all times for urgent questions. Please call the main number to the clinic [phone] and follow the prompts.  For any non-urgent questions, you may also contact your provider using MyChart. We now offer e-Visits for anyone 65 and older to request care online for non-urgent symptoms. For details visit [email or web address].   Also download the MyChart app! Go to the app store, search "MyChart", open the app, select [place], and log in with your MyChart username and password.

## 2024-02-28 NOTE — Progress Notes (Signed)
 Patient began treatment on 02/21/2024. Here today for day eight.   Oncology Nurse Navigator Documentation     02/28/2024   10:45 AM  Oncology Nurse Navigator Flowsheets  Phase of Treatment Chemotherapy  Chemotherapy Actual Start Date: 02/21/2024  Navigator Follow Up Date: 03/20/2024  Navigator Follow Up Reason: Follow-up Appointment;Chemotherapy  Navigator Location [facility]  Navigator Encounter Type Appt/Treatment Plan Review  Treatment Initiated Date 02/21/2024  Patient Visit Type MedOnc  Treatment Phase Active Tx  Barriers/Navigation Needs Coordination of Care;Education  Interventions None Required  Acuity Level 2-Minimal Needs (1-2 Barriers Identified)  Time Spent with Patient 15

## 2024-02-28 NOTE — Progress Notes (Signed)
 Ok to treat with Creatinine 1.86 per Lauraine Pepper, NP  [facility] to give Darzalex  after 30 minutes post Singulair  per Leotis Ferries, [facility] since patient tolerated first dose without reaction.   Patient does not want to stay for the recommended observation time post Darzalex  injection. VSS. Patient discharged ambulatory without complaints or concerns.

## 2024-02-29 LAB — IGG, IGA, IGM
IgA: 89 mg/dL (ref 61–437)
IgG (Immunoglobin G), Serum: 972 mg/dL (ref 603–1613)
IgM (Immunoglobulin M), Srm: 19 mg/dL — ABNORMAL LOW (ref 20–172)

## 2024-02-29 LAB — KAPPA/LAMBDA LIGHT CHAINS
Kappa free light chain: 61.9 mg/L — ABNORMAL HIGH (ref 3.3–19.4)
Kappa, lambda light chain ratio: 8.6 — ABNORMAL HIGH (ref 0.26–1.65)
Lambda free light chains: 7.2 mg/L (ref 5.7–26.3)

## 2024-03-02 ENCOUNTER — Ambulatory Visit (HOSPITAL_COMMUNITY)

## 2024-03-02 ENCOUNTER — Inpatient Hospital Stay

## 2024-03-02 ENCOUNTER — Inpatient Hospital Stay: Admitting: Hematology & Oncology

## 2024-03-02 LAB — PROTEIN ELECTROPHORESIS, SERUM, WITH REFLEX
A/G Ratio: 1.2 (ref 0.7–1.7)
Albumin ELP: 3.3 g/dL (ref 2.9–4.4)
Alpha-1-Globulin: 0.2 g/dL (ref 0.0–0.4)
Alpha-2-Globulin: 0.8 g/dL (ref 0.4–1.0)
Beta Globulin: 0.9 g/dL (ref 0.7–1.3)
Gamma Globulin: 0.9 g/dL (ref 0.4–1.8)
Globulin, Total: 2.8 g/dL (ref 2.2–3.9)
M-Spike, %: 0.5 g/dL — ABNORMAL HIGH
SPEP Interpretation: 0
Total Protein ELP: 6.1 g/dL (ref 6.0–8.5)

## 2024-03-02 LAB — IMMUNOFIXATION REFLEX, SERUM
IgA: 89 mg/dL (ref 61–437)
IgG (Immunoglobin G), Serum: <30 mg/dL — ABNORMAL LOW (ref 603–1613)
IgM (Immunoglobulin M), Srm: <5 mg/dL — ABNORMAL LOW (ref 20–172)

## 2024-03-06 ENCOUNTER — Inpatient Hospital Stay

## 2024-03-06 ENCOUNTER — Inpatient Hospital Stay: Attending: Hematology & Oncology

## 2024-03-06 ENCOUNTER — Encounter: Payer: Self-pay | Admitting: *Deleted

## 2024-03-06 VITALS — BP 127/79 | HR 68 | Temp 97.7°F | Resp 19

## 2024-03-06 DIAGNOSIS — D472 Monoclonal gammopathy: Secondary | ICD-10-CM

## 2024-03-06 DIAGNOSIS — Z5112 Encounter for antineoplastic immunotherapy: Secondary | ICD-10-CM | POA: Insufficient documentation

## 2024-03-06 DIAGNOSIS — C9000 Multiple myeloma not having achieved remission: Secondary | ICD-10-CM | POA: Insufficient documentation

## 2024-03-06 LAB — CBC WITH DIFFERENTIAL (CANCER CENTER ONLY)
Abs Immature Granulocytes: 0.04 10*3/uL (ref 0.00–0.07)
Basophils Absolute: 0 10*3/uL (ref 0.0–0.1)
Basophils Relative: 0 %
Eosinophils Absolute: 0.1 10*3/uL (ref 0.0–0.5)
Eosinophils Relative: 1 %
HCT: 49.1 % (ref 39.0–52.0)
Hemoglobin: 17 g/dL (ref 13.0–17.0)
Immature Granulocytes: 0 %
Lymphocytes Relative: 12 %
Lymphs Abs: 1.1 10*3/uL (ref 0.7–4.0)
MCH: 32.3 pg (ref 26.0–34.0)
MCHC: 34.6 g/dL (ref 30.0–36.0)
MCV: 93.3 fL (ref 80.0–100.0)
Monocytes Absolute: 0.6 10*3/uL (ref 0.1–1.0)
Monocytes Relative: 7 %
Neutro Abs: 7.4 10*3/uL (ref 1.7–7.7)
Neutrophils Relative %: 80 %
Platelet Count: 251 10*3/uL (ref 150–400)
RBC: 5.26 MIL/uL (ref 4.22–5.81)
RDW: 13.8 % (ref 11.5–15.5)
WBC Count: 9.3 10*3/uL (ref 4.0–10.5)
nRBC: 0 % (ref 0.0–0.2)

## 2024-03-06 MED ORDER — DEXAMETHASONE 4 MG PO TABS: 20.0000 mg | ORAL_TABLET | Freq: Once | ORAL | Status: DC

## 2024-03-06 MED ORDER — ACETAMINOPHEN 325 MG PO TABS: 650.0000 mg | ORAL_TABLET | Freq: Once | ORAL | Status: DC

## 2024-03-06 MED ORDER — DIPHENHYDRAMINE HCL 25 MG PO CAPS: 50.0000 mg | ORAL_CAPSULE | Freq: Once | ORAL | Status: DC

## 2024-03-06 MED ORDER — MONTELUKAST SODIUM 10 MG PO TABS
10.0000 mg | ORAL_TABLET | Freq: Once | ORAL | Status: DC
  Filled 2024-03-06: qty 1

## 2024-03-06 MED ORDER — DARATUMUMAB-HYALURONIDASE-FIHJ 1800-30000 MG-UT/15ML ~~LOC~~ SOLN
1800.0000 mg | Freq: Once | SUBCUTANEOUS | Status: AC
  Administered 2024-03-06: 1800 mg via SUBCUTANEOUS
  Filled 2024-03-06: qty 15

## 2024-03-06 NOTE — Patient Instructions (Signed)
 [facility]  Discharge Instructions: Thank you for choosing [facility] to provide your oncology and hematology care.   If you have a lab appointment with the Cancer Center, please go directly to the Cancer Center and check in at the registration area.  Wear comfortable clothing and clothing appropriate for easy access to any Portacath or PICC line.   We strive to give you quality time with your provider. You may need to reschedule your appointment if you arrive late (15 or more minutes).  Arriving late affects you and other patients whose appointments are after yours.  Also, if you miss three or more appointments without notifying the office, you may be dismissed from the clinic at the provider's discretion.      For prescription refill requests, have your pharmacy contact our office and allow 72 hours for refills to be completed.    Today you received the following chemotherapy and/or immunotherapy agents:  Faspro      To help prevent nausea and vomiting after your treatment, we encourage you to take your nausea medication as directed.  BELOW ARE SYMPTOMS THAT SHOULD BE REPORTED IMMEDIATELY: *FEVER GREATER THAN 100.4 F (38 C) OR HIGHER *CHILLS OR SWEATING *NAUSEA AND VOMITING THAT IS NOT CONTROLLED WITH YOUR NAUSEA MEDICATION *UNUSUAL SHORTNESS OF BREATH *UNUSUAL BRUISING OR BLEEDING *URINARY PROBLEMS (pain or burning when urinating, or frequent urination) *BOWEL PROBLEMS (unusual diarrhea, constipation, pain near the anus) TENDERNESS IN MOUTH AND THROAT WITH OR WITHOUT PRESENCE OF ULCERS (sore throat, sores in mouth, or a toothache) UNUSUAL RASH, SWELLING OR PAIN  UNUSUAL VAGINAL DISCHARGE OR ITCHING   Items with * indicate a potential emergency and should be followed up as soon as possible or go to the Emergency Department if any problems should occur.  Please show the CHEMOTHERAPY ALERT CARD or IMMUNOTHERAPY  ALERT CARD at check-in to the Emergency Department and triage nurse. Should you have questions after your visit or need to cancel or reschedule your appointment, please contact [facility] - A DEPT OF [facility]  [phone] and follow the prompts.  Office hours are 8:00 a.m. to 4:30 p.m. Monday - Friday. Please note that voicemails left after 4:00 p.m. may not be returned until the following business day.  We are closed weekends and major holidays. You have access to a nurse at all times for urgent questions. Please call the main number to the clinic [phone] and follow the prompts.  For any non-urgent questions, you may also contact your provider using MyChart. We now offer e-Visits for anyone 66 and older to request care online for non-urgent symptoms. For details visit [email or web address].   Also download the MyChart app! Go to the app store, search "MyChart", open the app, select [place], and log in with your MyChart username and password.

## 2024-03-06 NOTE — Progress Notes (Signed)
 Patient requesting proof of diagnosis for his MD's at the [facility]. Provided patient with copy of his path report and office notes. He was appreciative.   Oncology Nurse Navigator Documentation     03/06/2024    8:15 AM  Oncology Nurse Navigator Flowsheets  Navigator Follow Up Date: 03/20/2024  Navigator Follow Up Reason: Follow-up Appointment  Navigator Location [facility]  Navigator Encounter Type Treatment  Patient Visit Type MedOnc  Treatment Phase Active Tx  Barriers/Navigation Needs Coordination of Care;Education  Interventions Other  Acuity Level 2-Minimal Needs (1-2 Barriers Identified)  Time Spent with Patient 15

## 2024-03-08 ENCOUNTER — Other Ambulatory Visit: Payer: Self-pay

## 2024-03-08 ENCOUNTER — Inpatient Hospital Stay

## 2024-03-13 ENCOUNTER — Inpatient Hospital Stay

## 2024-03-13 VITALS — BP 152/83 | HR 72 | Temp 97.1°F | Resp 18

## 2024-03-13 DIAGNOSIS — C9000 Multiple myeloma not having achieved remission: Secondary | ICD-10-CM | POA: Diagnosis not present

## 2024-03-13 DIAGNOSIS — D472 Monoclonal gammopathy: Secondary | ICD-10-CM

## 2024-03-13 DIAGNOSIS — Z5112 Encounter for antineoplastic immunotherapy: Secondary | ICD-10-CM | POA: Diagnosis not present

## 2024-03-13 LAB — CBC WITH DIFFERENTIAL (CANCER CENTER ONLY)
Abs Immature Granulocytes: 0.03 K/uL (ref 0.00–0.07)
Basophils Absolute: 0.1 K/uL (ref 0.0–0.1)
Basophils Relative: 1 %
Eosinophils Absolute: 0.1 K/uL (ref 0.0–0.5)
Eosinophils Relative: 2 %
HCT: 51.7 % (ref 39.0–52.0)
Hemoglobin: 17.7 g/dL — ABNORMAL HIGH (ref 13.0–17.0)
Immature Granulocytes: 0 %
Lymphocytes Relative: 18 %
Lymphs Abs: 1.2 K/uL (ref 0.7–4.0)
MCH: 32.5 pg (ref 26.0–34.0)
MCHC: 34.2 g/dL (ref 30.0–36.0)
MCV: 94.9 fL (ref 80.0–100.0)
Monocytes Absolute: 0.7 K/uL (ref 0.1–1.0)
Monocytes Relative: 10 %
Neutro Abs: 4.8 K/uL (ref 1.7–7.7)
Neutrophils Relative %: 69 %
Platelet Count: 214 K/uL (ref 150–400)
RBC: 5.45 MIL/uL (ref 4.22–5.81)
RDW: 14.3 % (ref 11.5–15.5)
WBC Count: 6.9 K/uL (ref 4.0–10.5)
nRBC: 0 % (ref 0.0–0.2)

## 2024-03-13 MED ORDER — DEXAMETHASONE 4 MG PO TABS
20.0000 mg | ORAL_TABLET | Freq: Once | ORAL | Status: DC
Start: 2024-03-13 — End: 2024-03-13

## 2024-03-13 MED ORDER — DARATUMUMAB-HYALURONIDASE-FIHJ 1800-30000 MG-UT/15ML ~~LOC~~ SOLN
1800.0000 mg | Freq: Once | SUBCUTANEOUS | Status: AC
  Administered 2024-03-13: 1800 mg via SUBCUTANEOUS
  Filled 2024-03-13: qty 15

## 2024-03-13 MED ORDER — ACETAMINOPHEN 325 MG PO TABS: 650.0000 mg | ORAL_TABLET | Freq: Once | ORAL | Status: DC

## 2024-03-13 MED ORDER — DIPHENHYDRAMINE HCL 25 MG PO CAPS: 50.0000 mg | ORAL_CAPSULE | Freq: Once | ORAL | Status: DC

## 2024-03-13 NOTE — Patient Instructions (Signed)
 [facility]  Discharge Instructions: Thank you for choosing [facility] to provide your oncology and hematology care.   If you have a lab appointment with the Cancer Center, please go directly to the Cancer Center and check in at the registration area.  Wear comfortable clothing and clothing appropriate for easy access to any Portacath or PICC line.   We strive to give you quality time with your provider. You may need to reschedule your appointment if you arrive late (15 or more minutes).  Arriving late affects you and other patients whose appointments are after yours.  Also, if you miss three or more appointments without notifying the office, you may be dismissed from the clinic at the provider's discretion.      For prescription refill requests, have your pharmacy contact our office and allow 72 hours for refills to be completed.    Today you received the following chemotherapy and/or immunotherapy agents:  Faspro      To help prevent nausea and vomiting after your treatment, we encourage you to take your nausea medication as directed.  BELOW ARE SYMPTOMS THAT SHOULD BE REPORTED IMMEDIATELY: *FEVER GREATER THAN 100.4 F (38 C) OR HIGHER *CHILLS OR SWEATING *NAUSEA AND VOMITING THAT IS NOT CONTROLLED WITH YOUR NAUSEA MEDICATION *UNUSUAL SHORTNESS OF BREATH *UNUSUAL BRUISING OR BLEEDING *URINARY PROBLEMS (pain or burning when urinating, or frequent urination) *BOWEL PROBLEMS (unusual diarrhea, constipation, pain near the anus) TENDERNESS IN MOUTH AND THROAT WITH OR WITHOUT PRESENCE OF ULCERS (sore throat, sores in mouth, or a toothache) UNUSUAL RASH, SWELLING OR PAIN  UNUSUAL VAGINAL DISCHARGE OR ITCHING   Items with * indicate a potential emergency and should be followed up as soon as possible or go to the Emergency Department if any problems should occur.  Please show the CHEMOTHERAPY ALERT CARD or IMMUNOTHERAPY  ALERT CARD at check-in to the Emergency Department and triage nurse. Should you have questions after your visit or need to cancel or reschedule your appointment, please contact [facility] - A DEPT OF [facility]  [phone] and follow the prompts.  Office hours are 8:00 a.m. to 4:30 p.m. Monday - Friday. Please note that voicemails left after 4:00 p.m. may not be returned until the following business day.  We are closed weekends and major holidays. You have access to a nurse at all times for urgent questions. Please call the main number to the clinic [phone] and follow the prompts.  For any non-urgent questions, you may also contact your provider using MyChart. We now offer e-Visits for anyone 66 and older to request care online for non-urgent symptoms. For details visit [email or web address].   Also download the MyChart app! Go to the app store, search "MyChart", open the app, select [place], and log in with your MyChart username and password.

## 2024-03-16 ENCOUNTER — Inpatient Hospital Stay

## 2024-03-16 ENCOUNTER — Inpatient Hospital Stay: Admitting: Family

## 2024-03-19 ENCOUNTER — Other Ambulatory Visit: Payer: Self-pay | Admitting: Family

## 2024-03-19 DIAGNOSIS — C9000 Multiple myeloma not having achieved remission: Secondary | ICD-10-CM

## 2024-03-20 ENCOUNTER — Encounter: Payer: Self-pay | Admitting: Family

## 2024-03-20 ENCOUNTER — Inpatient Hospital Stay (HOSPITAL_BASED_OUTPATIENT_CLINIC_OR_DEPARTMENT_OTHER): Admitting: Family

## 2024-03-20 ENCOUNTER — Inpatient Hospital Stay

## 2024-03-20 ENCOUNTER — Encounter: Payer: Self-pay | Admitting: *Deleted

## 2024-03-20 VITALS — BP 133/78 | HR 72 | Temp 98.0°F | Resp 18 | Ht 70.0 in | Wt 226.4 lb

## 2024-03-20 VITALS — BP 158/95 | HR 74

## 2024-03-20 DIAGNOSIS — C9000 Multiple myeloma not having achieved remission: Secondary | ICD-10-CM | POA: Diagnosis not present

## 2024-03-20 DIAGNOSIS — D472 Monoclonal gammopathy: Secondary | ICD-10-CM

## 2024-03-20 DIAGNOSIS — Z5112 Encounter for antineoplastic immunotherapy: Secondary | ICD-10-CM | POA: Diagnosis not present

## 2024-03-20 LAB — CBC WITH DIFFERENTIAL (CANCER CENTER ONLY)
Abs Immature Granulocytes: 0.03 K/uL (ref 0.00–0.07)
Basophils Absolute: 0.1 K/uL (ref 0.0–0.1)
Basophils Relative: 1 %
Eosinophils Absolute: 0.2 K/uL (ref 0.0–0.5)
Eosinophils Relative: 2 %
HCT: 50.4 % (ref 39.0–52.0)
Hemoglobin: 17.5 g/dL — ABNORMAL HIGH (ref 13.0–17.0)
Immature Granulocytes: 0 %
Lymphocytes Relative: 18 %
Lymphs Abs: 1.4 K/uL (ref 0.7–4.0)
MCH: 32.9 pg (ref 26.0–34.0)
MCHC: 34.7 g/dL (ref 30.0–36.0)
MCV: 94.7 fL (ref 80.0–100.0)
Monocytes Absolute: 0.7 K/uL (ref 0.1–1.0)
Monocytes Relative: 9 %
Neutro Abs: 5.6 K/uL (ref 1.7–7.7)
Neutrophils Relative %: 70 %
Platelet Count: 220 K/uL (ref 150–400)
RBC: 5.32 MIL/uL (ref 4.22–5.81)
RDW: 14.2 % (ref 11.5–15.5)
WBC Count: 8 K/uL (ref 4.0–10.5)
nRBC: 0 % (ref 0.0–0.2)

## 2024-03-20 LAB — CMP (CANCER CENTER ONLY)
ALT: 24 U/L (ref 0–44)
AST: 23 U/L (ref 15–41)
Albumin: 4 g/dL (ref 3.5–5.0)
Alkaline Phosphatase: 51 U/L (ref 38–126)
Anion gap: 8 (ref 5–15)
BUN: 37 mg/dL — ABNORMAL HIGH (ref 8–23)
CO2: 26 mmol/L (ref 22–32)
Calcium: 9.7 mg/dL (ref 8.9–10.3)
Chloride: 101 mmol/L (ref 98–111)
Creatinine: 2.01 mg/dL — ABNORMAL HIGH (ref 0.61–1.24)
GFR, Estimated: 35 mL/min — ABNORMAL LOW (ref >60)
Glucose, Bld: 94 mg/dL (ref 70–99)
Potassium: 4.6 mmol/L (ref 3.5–5.1)
Sodium: 135 mmol/L (ref 135–145)
Total Bilirubin: 0.6 mg/dL (ref 0.0–1.2)
Total Protein: 6.5 g/dL (ref 6.5–8.1)

## 2024-03-20 MED ORDER — DEXAMETHASONE 4 MG PO TABS: 20.0000 mg | ORAL_TABLET | Freq: Once | ORAL | Status: DC

## 2024-03-20 MED ORDER — DIPHENHYDRAMINE HCL 25 MG PO CAPS: 50.0000 mg | ORAL_CAPSULE | Freq: Once | ORAL | Status: DC

## 2024-03-20 MED ORDER — DARATUMUMAB-HYALURONIDASE-FIHJ 1800-30000 MG-UT/15ML ~~LOC~~ SOLN
1800.0000 mg | Freq: Once | SUBCUTANEOUS | Status: AC
  Administered 2024-03-20: 1800 mg via SUBCUTANEOUS
  Filled 2024-03-20: qty 15

## 2024-03-20 MED ORDER — ACETAMINOPHEN 325 MG PO TABS: 650.0000 mg | ORAL_TABLET | Freq: Once | ORAL | Status: DC

## 2024-03-20 NOTE — Patient Instructions (Signed)
 [facility]  Discharge Instructions: Thank you for choosing [facility] to provide your oncology and hematology care.   If you have a lab appointment with the Cancer Center, please go directly to the Cancer Center and check in at the registration area.  Wear comfortable clothing and clothing appropriate for easy access to any Portacath or PICC line.   We strive to give you quality time with your provider. You may need to reschedule your appointment if you arrive late (15 or more minutes).  Arriving late affects you and other patients whose appointments are after yours.  Also, if you miss three or more appointments without notifying the office, you may be dismissed from the clinic at the provider's discretion.      For prescription refill requests, have your pharmacy contact our office and allow 72 hours for refills to be completed.    Today you received the following chemotherapy and/or immunotherapy agents faspro      To help prevent nausea and vomiting after your treatment, we encourage you to take your nausea medication as directed.  BELOW ARE SYMPTOMS THAT SHOULD BE REPORTED IMMEDIATELY: *FEVER GREATER THAN 100.4 F (38 C) OR HIGHER *CHILLS OR SWEATING *NAUSEA AND VOMITING THAT IS NOT CONTROLLED WITH YOUR NAUSEA MEDICATION *UNUSUAL SHORTNESS OF BREATH *UNUSUAL BRUISING OR BLEEDING *URINARY PROBLEMS (pain or burning when urinating, or frequent urination) *BOWEL PROBLEMS (unusual diarrhea, constipation, pain near the anus) TENDERNESS IN MOUTH AND THROAT WITH OR WITHOUT PRESENCE OF ULCERS (sore throat, sores in mouth, or a toothache) UNUSUAL RASH, SWELLING OR PAIN  UNUSUAL VAGINAL DISCHARGE OR ITCHING   Items with * indicate a potential emergency and should be followed up as soon as possible or go to the Emergency Department if any problems should occur.  Please show the CHEMOTHERAPY ALERT CARD or IMMUNOTHERAPY ALERT  CARD at check-in to the Emergency Department and triage nurse. Should you have questions after your visit or need to cancel or reschedule your appointment, please contact [facility] - A DEPT OF [facility]  [phone] and follow the prompts.  Office hours are 8:00 a.m. to 4:30 p.m. Monday - Friday. Please note that voicemails left after 4:00 p.m. may not be returned until the following business day.  We are closed weekends and major holidays. You have access to a nurse at all times for urgent questions. Please call the main number to the clinic [phone] and follow the prompts.  For any non-urgent questions, you may also contact your provider using MyChart. We now offer e-Visits for anyone 66 and older to request care online for non-urgent symptoms. For details visit [email or web address].   Also download the MyChart app! Go to the app store, search "MyChart", open the app, select , and log in with your MyChart username and password.

## 2024-03-20 NOTE — Progress Notes (Signed)
 Hematology and Oncology Follow Up Visit  Kevin [name] [id number] [birth year] 68 y.o. 03/20/2024   Principle Diagnosis:  IgG Kappa light chain smoldering myeloma -high risk   Current Therapy:         Faspro weekly   Interim History:  Kevin [name] is here today for follow-up and treatment day 1 of cycle 2. He is doing well and has no complaints at this time.  His kappa light chains had dropped significantly from 626 to 61 mg/L, M-spike was 0.5% and IgG level remained stable at 972.  He has had no issues so far with treatment.  He continues to have frequent urination.  No blood loss noted. No abnormal bruising, no petechiae.  No fever, chills, n/v, cough, rash, dizziness, SOB, chest pain, palpitations, abdominal pain or changes in bowel or bladder habits.  No swelling, tenderness, numbness or tingling in his extremities.  No falls or syncope.  Appetite and hydration are good. Weight is stable at 159 lbs.  He continues to stay active working out regularly.   ECOG Performance Status: 1 - Symptomatic but completely ambulatory  Medications:  Allergies as of 03/20/2024       Reactions   Nsaids Other (See Comments)   Avoids due to low kidney function, tolerates low dose aspirin         Medication List        Accurate as of March 20, 2024  8:46 AM. If you have any questions, ask your nurse or doctor.          aspirin EC 81 MG tablet Take 81 mg by mouth daily.   BENEFIBER PO Take by mouth daily.   [employer] Vitamin D3 250 MCG (10000 UT) Caps Generic drug: Cholecalciferol Take 10,000 Units by mouth daily.   dexamethasone  4 MG tablet Commonly known as: DECADRON  Take 5 tablets the morning of chemotherapy   Farxiga 10 MG Tabs tablet Generic drug: dapagliflozin propanediol Take 10 mg by mouth daily.   Fish Oil 500 MG Caps Take by mouth daily.   losartan 50 MG tablet Commonly known as: COZAAR Take 50 mg by mouth daily.   multivitamin tablet Take 1 tablet by mouth  daily.   ondansetron  8 MG tablet Commonly known as: Zofran  Take 1 tablet (8 mg total) by mouth every 8 (eight) hours as needed for nausea or vomiting.   QC TUMERIC COMPLEX PO Take 1,000 mg by mouth.   vitamin k 100 MCG tablet Take 100 mcg by mouth daily.        Allergies:  Allergies  Allergen Reactions   Nsaids Other (See Comments)    Avoids due to low kidney function, tolerates low dose aspirin     Past Medical History, Surgical history, Social history, and Family History were reviewed and updated.  Review of Systems: All other 10 point review of systems is negative.   Physical Exam:  height is 5' 10 (1.778 m) and weight is 226 lb 6.4 oz (102.7 kg). His oral temperature is 98 F (36.7 C). His blood pressure is 133/78 and his pulse is 72. His respiration is 18 and oxygen saturation is 96%.   Wt Readings from Last 3 Encounters:  03/20/24 226 lb 6.4 oz (102.7 kg)  02/09/24 215 lb 1.9 oz (97.6 kg)  12/14/23 223 lb (101.2 kg)    Ocular: Sclerae unicteric, pupils equal, round and reactive to light Ear-nose-throat: Oropharynx clear, dentition fair Lymphatic: No cervical or supraclavicular adenopathy Lungs no rales or rhonchi, good excursion bilaterally  Heart regular rate and rhythm, no murmur appreciated Abd soft, nontender, positive bowel sounds MSK no focal spinal tenderness, no joint edema Neuro: non-focal, well-oriented, appropriate affect Breasts: Deferred   Lab Results  Component Value Date   WBC 8.0 03/20/2024   HGB 17.5 (H) 03/20/2024   HCT 50.4 03/20/2024   MCV 94.7 03/20/2024   PLT 220 03/20/2024   Lab Results  Component Value Date   FERRITIN 37 12/14/2023   IRON 152 12/14/2023   TIBC 360 12/14/2023   UIBC 208 12/14/2023   IRONPCTSAT 42 (H) 12/14/2023   Lab Results  Component Value Date   RETICCTPCT 1.1 12/14/2023   RBC 5.32 03/20/2024   Lab Results  Component Value Date   KPAFRELGTCHN 61.9 (H) 02/28/2024   LAMBDASER 7.2 02/28/2024    KAPLAMBRATIO 8.60 (H) 02/28/2024   Lab Results  Component Value Date   IGGSERUM 972 02/28/2024   IGGSERUM <30 (L) 02/28/2024   IGA 89 02/28/2024   IGA 89 02/28/2024   IGMSERUM 19 (L) 02/28/2024   IGMSERUM <5 (L) 02/28/2024   Lab Results  Component Value Date   TOTALPROTELP 6.1 02/28/2024   ALBUMINELP 3.3 02/28/2024   A1GS 0.2 02/28/2024   A2GS 0.8 02/28/2024   BETS 0.9 02/28/2024   GAMS 0.9 02/28/2024   MSPIKE 0.5 (H) 02/28/2024   SPEI Comment 12/14/2023     Chemistry      Component Value Date/Time   NA 135 03/20/2024 0804   K 4.6 03/20/2024 0804   CL 101 03/20/2024 0804   CO2 26 03/20/2024 0804   BUN 37 (H) 03/20/2024 0804   CREATININE 2.01 (H) 03/20/2024 0804      Component Value Date/Time   CALCIUM 9.7 03/20/2024 0804   ALKPHOS 51 03/20/2024 0804   AST 23 03/20/2024 0804   ALT 24 03/20/2024 0804   BILITOT 0.6 03/20/2024 0804       Impression and Plan: Kevin [name] is a pleasant 68 yo gentleman with 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  So far, he has tolerated treatment nicely and appears to having a good response.  We will proceed with treatment today as planned.  He has his current schedule through August. Follow-up with MD in 4 weeks.   Lauraine Pepper, NP 7/15/20258:46 AM

## 2024-03-20 NOTE — Progress Notes (Signed)
 Initial RN Navigator Patient Visit  Patient was seen and initiated treatment when this navigator was out of the office.   Gave patient Your Patient Navigator handout which explains my role, areas in which I am able to help, and all the contact information for myself and the office. Also gave patient MD and Navigator business card.   New patient packet given to patient which includes: orientation to office and staff; campus directory; education on My Chart and Advance Directives; and patient centered education on Smoldering/Multiple Myeloma.   Patient expressed frustration with medical records. His lawyer had reached out and didn't get full pathology reports. [facility] and [facility]'ve not been getting appointment [facility]. There is a ROI on file for both.  Pathology Reports routed to his lawyers office [place]). [facility] was [facility] to [facility]'s treatment team  Patient understands all follow up procedures and expectations. They have my number to reach out for any further clarification or additional needs. Will call patient in 5-7 days to see if any further needs have presented, or if patient has any further questions or needs.   Oncology Nurse Navigator Documentation     03/20/2024    9:00 AM  Oncology Nurse Navigator Flowsheets  Navigator Follow Up Date: 04/17/2024  Navigator Follow Up Reason: Follow-up Appointment;Chemotherapy  Navigator Radiographer, therapeutic Encounter Type Treatment  Patient Visit Type MedOnc  Treatment Phase Active Tx  Barriers/Navigation Needs Coordination of Care;Education  Education Other  Interventions Coordination of Care;Education  Acuity Level 2-Minimal Needs (1-2 Barriers Identified)  Coordination of Care Other  Education Method Verbal;Written  Time Spent with Patient 30

## 2024-03-23 ENCOUNTER — Inpatient Hospital Stay

## 2024-03-27 ENCOUNTER — Inpatient Hospital Stay

## 2024-03-27 VITALS — BP 122/72 | HR 84 | Temp 97.7°F | Resp 18

## 2024-03-27 DIAGNOSIS — Z5112 Encounter for antineoplastic immunotherapy: Secondary | ICD-10-CM | POA: Diagnosis not present

## 2024-03-27 DIAGNOSIS — D472 Monoclonal gammopathy: Secondary | ICD-10-CM

## 2024-03-27 DIAGNOSIS — C9000 Multiple myeloma not having achieved remission: Secondary | ICD-10-CM | POA: Diagnosis not present

## 2024-03-27 LAB — CBC WITH DIFFERENTIAL (CANCER CENTER ONLY)
Abs Immature Granulocytes: 0.04 K/uL (ref 0.00–0.07)
Basophils Absolute: 0.1 K/uL (ref 0.0–0.1)
Basophils Relative: 1 %
Eosinophils Absolute: 0.1 K/uL (ref 0.0–0.5)
Eosinophils Relative: 1 %
HCT: 49.4 % (ref 39.0–52.0)
Hemoglobin: 17 g/dL (ref 13.0–17.0)
Immature Granulocytes: 0 %
Lymphocytes Relative: 10 %
Lymphs Abs: 1.1 K/uL (ref 0.7–4.0)
MCH: 32.5 pg (ref 26.0–34.0)
MCHC: 34.4 g/dL (ref 30.0–36.0)
MCV: 94.5 fL (ref 80.0–100.0)
Monocytes Absolute: 0.9 K/uL (ref 0.1–1.0)
Monocytes Relative: 8 %
Neutro Abs: 8.7 K/uL — ABNORMAL HIGH (ref 1.7–7.7)
Neutrophils Relative %: 80 %
Platelet Count: 276 K/uL (ref 150–400)
RBC: 5.23 MIL/uL (ref 4.22–5.81)
RDW: 13.9 % (ref 11.5–15.5)
WBC Count: 10.8 K/uL — ABNORMAL HIGH (ref 4.0–10.5)
nRBC: 0 % (ref 0.0–0.2)

## 2024-03-27 MED ORDER — ACETAMINOPHEN 325 MG PO TABS
650.0000 mg | ORAL_TABLET | Freq: Once | ORAL | Status: DC
Start: 2024-03-27 — End: 2024-03-27

## 2024-03-27 MED ORDER — DARATUMUMAB-HYALURONIDASE-FIHJ 1800-30000 MG-UT/15ML ~~LOC~~ SOLN
1800.0000 mg | Freq: Once | SUBCUTANEOUS | Status: AC
  Administered 2024-03-27: 1800 mg via SUBCUTANEOUS
  Filled 2024-03-27: qty 15

## 2024-03-27 MED ORDER — DIPHENHYDRAMINE HCL 25 MG PO CAPS
50.0000 mg | ORAL_CAPSULE | Freq: Once | ORAL | Status: DC
Start: 2024-03-27 — End: 2024-03-27

## 2024-03-27 MED ORDER — DEXAMETHASONE 4 MG PO TABS
20.0000 mg | ORAL_TABLET | Freq: Once | ORAL | Status: DC
Start: 2024-03-27 — End: 2024-03-27

## 2024-03-27 NOTE — Patient Instructions (Signed)
 [facility]  Discharge Instructions: Thank you for choosing [facility] to provide your oncology and hematology care.   If you have a lab appointment with the Cancer Center, please go directly to the Cancer Center and check in at the registration area.  Wear comfortable clothing and clothing appropriate for easy access to any Portacath or PICC line.   We strive to give you quality time with your provider. You may need to reschedule your appointment if you arrive late (15 or more minutes).  Arriving late affects you and other patients whose appointments are after yours.  Also, if you miss three or more appointments without notifying the office, you may be dismissed from the clinic at the provider's discretion.      For prescription refill requests, have your pharmacy contact our office and allow 72 hours for refills to be completed.    Today you received the following chemotherapy and/or immunotherapy agents:  Faspro      To help prevent nausea and vomiting after your treatment, we encourage you to take your nausea medication as directed.  BELOW ARE SYMPTOMS THAT SHOULD BE REPORTED IMMEDIATELY: *FEVER GREATER THAN 100.4 F (38 C) OR HIGHER *CHILLS OR SWEATING *NAUSEA AND VOMITING THAT IS NOT CONTROLLED WITH YOUR NAUSEA MEDICATION *UNUSUAL SHORTNESS OF BREATH *UNUSUAL BRUISING OR BLEEDING *URINARY PROBLEMS (pain or burning when urinating, or frequent urination) *BOWEL PROBLEMS (unusual diarrhea, constipation, pain near the anus) TENDERNESS IN MOUTH AND THROAT WITH OR WITHOUT PRESENCE OF ULCERS (sore throat, sores in mouth, or a toothache) UNUSUAL RASH, SWELLING OR PAIN  UNUSUAL VAGINAL DISCHARGE OR ITCHING   Items with * indicate a potential emergency and should be followed up as soon as possible or go to the Emergency Department if any problems should occur.  Please show the CHEMOTHERAPY ALERT CARD or IMMUNOTHERAPY  ALERT CARD at check-in to the Emergency Department and triage nurse. Should you have questions after your visit or need to cancel or reschedule your appointment, please contact [facility] - A DEPT OF [facility]  [phone] and follow the prompts.  Office hours are 8:00 a.m. to 4:30 p.m. Monday - Friday. Please note that voicemails left after 4:00 p.m. may not be returned until the following business day.  We are closed weekends and major holidays. You have access to a nurse at all times for urgent questions. Please call the main number to the clinic [phone] and follow the prompts.  For any non-urgent questions, you may also contact your provider using MyChart. We now offer e-Visits for anyone 66 and older to request care online for non-urgent symptoms. For details visit [email or web address].   Also download the MyChart app! Go to the app store, search "MyChart", open the app, select [place], and log in with your MyChart username and password.

## 2024-03-30 ENCOUNTER — Inpatient Hospital Stay

## 2024-04-03 ENCOUNTER — Inpatient Hospital Stay

## 2024-04-03 VITALS — BP 147/62 | HR 92 | Resp 18

## 2024-04-03 DIAGNOSIS — D472 Monoclonal gammopathy: Secondary | ICD-10-CM

## 2024-04-03 DIAGNOSIS — Z5112 Encounter for antineoplastic immunotherapy: Secondary | ICD-10-CM | POA: Diagnosis not present

## 2024-04-03 DIAGNOSIS — C9000 Multiple myeloma not having achieved remission: Secondary | ICD-10-CM | POA: Diagnosis not present

## 2024-04-03 LAB — CBC WITH DIFFERENTIAL (CANCER CENTER ONLY)
Abs Immature Granulocytes: 0.02 K/uL (ref 0.00–0.07)
Basophils Absolute: 0 K/uL (ref 0.0–0.1)
Basophils Relative: 1 %
Eosinophils Absolute: 0.1 K/uL (ref 0.0–0.5)
Eosinophils Relative: 2 %
HCT: 47.7 % (ref 39.0–52.0)
Hemoglobin: 16.3 g/dL (ref 13.0–17.0)
Immature Granulocytes: 0 %
Lymphocytes Relative: 13 %
Lymphs Abs: 1 K/uL (ref 0.7–4.0)
MCH: 32.7 pg (ref 26.0–34.0)
MCHC: 34.2 g/dL (ref 30.0–36.0)
MCV: 95.6 fL (ref 80.0–100.0)
Monocytes Absolute: 0.6 K/uL (ref 0.1–1.0)
Monocytes Relative: 9 %
Neutro Abs: 5.3 K/uL (ref 1.7–7.7)
Neutrophils Relative %: 75 %
Platelet Count: 265 K/uL (ref 150–400)
RBC: 4.99 MIL/uL (ref 4.22–5.81)
RDW: 14.2 % (ref 11.5–15.5)
WBC Count: 7.1 K/uL (ref 4.0–10.5)
nRBC: 0 % (ref 0.0–0.2)

## 2024-04-03 MED ORDER — DIPHENHYDRAMINE HCL 25 MG PO CAPS
50.0000 mg | ORAL_CAPSULE | Freq: Once | ORAL | Status: DC
Start: 2024-04-03 — End: 2024-04-03

## 2024-04-03 MED ORDER — ACETAMINOPHEN 325 MG PO TABS: 650.0000 mg | ORAL_TABLET | Freq: Once | ORAL | Status: DC

## 2024-04-03 MED ORDER — DARATUMUMAB-HYALURONIDASE-FIHJ 1800-30000 MG-UT/15ML ~~LOC~~ SOLN
1800.0000 mg | Freq: Once | SUBCUTANEOUS | Status: AC
  Administered 2024-04-03: 1800 mg via SUBCUTANEOUS
  Filled 2024-04-03: qty 15

## 2024-04-03 MED ORDER — DEXAMETHASONE 4 MG PO TABS: 20.0000 mg | ORAL_TABLET | Freq: Once | ORAL | Status: DC

## 2024-04-03 NOTE — Patient Instructions (Signed)
 [facility]  Discharge Instructions: Thank you for choosing [facility] to provide your oncology and hematology care.   If you have a lab appointment with the Cancer Center, please go directly to the Cancer Center and check in at the registration area.  Wear comfortable clothing and clothing appropriate for easy access to any Portacath or PICC line.   We strive to give you quality time with your provider. You may need to reschedule your appointment if you arrive late (15 or more minutes).  Arriving late affects you and other patients whose appointments are after yours.  Also, if you miss three or more appointments without notifying the office, you may be dismissed from the clinic at the provider's discretion.      For prescription refill requests, have your pharmacy contact our office and allow 72 hours for refills to be completed.    Today you received the following chemotherapy and/or immunotherapy agents Darzalex.      To help prevent nausea and vomiting after your treatment, we encourage you to take your nausea medication as directed.  BELOW ARE SYMPTOMS THAT SHOULD BE REPORTED IMMEDIATELY: *FEVER GREATER THAN 100.4 F (38 C) OR HIGHER *CHILLS OR SWEATING *NAUSEA AND VOMITING THAT IS NOT CONTROLLED WITH YOUR NAUSEA MEDICATION *UNUSUAL SHORTNESS OF BREATH *UNUSUAL BRUISING OR BLEEDING *URINARY PROBLEMS (pain or burning when urinating, or frequent urination) *BOWEL PROBLEMS (unusual diarrhea, constipation, pain near the anus) TENDERNESS IN MOUTH AND THROAT WITH OR WITHOUT PRESENCE OF ULCERS (sore throat, sores in mouth, or a toothache) UNUSUAL RASH, SWELLING OR PAIN  UNUSUAL VAGINAL DISCHARGE OR ITCHING   Items with * indicate a potential emergency and should be followed up as soon as possible or go to the Emergency Department if any problems should occur.  Please show the CHEMOTHERAPY ALERT CARD or IMMUNOTHERAPY  ALERT CARD at check-in to the Emergency Department and triage nurse. Should you have questions after your visit or need to cancel or reschedule your appointment, please contact [facility] - A DEPT OF [facility]ideg[facility]  [phone] and follow the prompts.  Office hours are 8:00 a.m. to 4:30 p.m. Monday - Friday. Please note that voicemails left after 4:00 p.m. may not be returned until the following business day.  We are closed weekends and major holidays. You have access to a nurse at all times for urgent questions. Please call the main number to the clinic [phone] and follow the prompts.  For any non-urgent questions, you may also contact your provider using MyChart. We now offer e-Visits for anyone 65 and older to request care online for non-urgent symptoms. For details visit [email or web address].   Also download the MyChart app! Go to the app store, search "MyChart", open the app, select [place], and log in with your MyChart username and password.

## 2024-04-06 ENCOUNTER — Inpatient Hospital Stay

## 2024-04-10 ENCOUNTER — Inpatient Hospital Stay

## 2024-04-10 ENCOUNTER — Inpatient Hospital Stay: Attending: Hematology & Oncology

## 2024-04-10 VITALS — BP 145/83 | HR 68 | Temp 97.9°F | Resp 18

## 2024-04-10 DIAGNOSIS — Z7984 Long term (current) use of oral hypoglycemic drugs: Secondary | ICD-10-CM | POA: Insufficient documentation

## 2024-04-10 DIAGNOSIS — D472 Monoclonal gammopathy: Secondary | ICD-10-CM

## 2024-04-10 DIAGNOSIS — Z5112 Encounter for antineoplastic immunotherapy: Secondary | ICD-10-CM | POA: Diagnosis not present

## 2024-04-10 DIAGNOSIS — R350 Frequency of micturition: Secondary | ICD-10-CM | POA: Diagnosis not present

## 2024-04-10 DIAGNOSIS — Z79899 Other long term (current) drug therapy: Secondary | ICD-10-CM | POA: Diagnosis not present

## 2024-04-10 DIAGNOSIS — C9000 Multiple myeloma not having achieved remission: Secondary | ICD-10-CM | POA: Insufficient documentation

## 2024-04-10 LAB — CBC WITH DIFFERENTIAL (CANCER CENTER ONLY)
Abs Immature Granulocytes: 0.05 K/uL (ref 0.00–0.07)
Basophils Absolute: 0.1 K/uL (ref 0.0–0.1)
Basophils Relative: 1 %
Eosinophils Absolute: 0.1 K/uL (ref 0.0–0.5)
Eosinophils Relative: 2 %
HCT: 49.4 % (ref 39.0–52.0)
Hemoglobin: 17 g/dL (ref 13.0–17.0)
Immature Granulocytes: 1 %
Lymphocytes Relative: 13 %
Lymphs Abs: 1.1 K/uL (ref 0.7–4.0)
MCH: 32.9 pg (ref 26.0–34.0)
MCHC: 34.4 g/dL (ref 30.0–36.0)
MCV: 95.7 fL (ref 80.0–100.0)
Monocytes Absolute: 0.7 K/uL (ref 0.1–1.0)
Monocytes Relative: 8 %
Neutro Abs: 6.6 K/uL (ref 1.7–7.7)
Neutrophils Relative %: 75 %
Platelet Count: 246 K/uL (ref 150–400)
RBC: 5.16 MIL/uL (ref 4.22–5.81)
RDW: 13.9 % (ref 11.5–15.5)
WBC Count: 8.6 K/uL (ref 4.0–10.5)
nRBC: 0 % (ref 0.0–0.2)

## 2024-04-10 MED ORDER — ACETAMINOPHEN 325 MG PO TABS: 650.0000 mg | ORAL_TABLET | Freq: Once | ORAL | Status: DC

## 2024-04-10 MED ORDER — DARATUMUMAB-HYALURONIDASE-FIHJ 1800-30000 MG-UT/15ML ~~LOC~~ SOLN
1800.0000 mg | Freq: Once | SUBCUTANEOUS | Status: AC
  Administered 2024-04-10: 1800 mg via SUBCUTANEOUS
  Filled 2024-04-10: qty 15

## 2024-04-10 MED ORDER — DIPHENHYDRAMINE HCL 25 MG PO CAPS: 50.0000 mg | ORAL_CAPSULE | Freq: Once | ORAL | Status: DC

## 2024-04-10 MED ORDER — DEXAMETHASONE 4 MG PO TABS: 20.0000 mg | ORAL_TABLET | Freq: Once | ORAL | Status: DC

## 2024-04-10 NOTE — Patient Instructions (Signed)
 [facility]  Discharge Instructions: Thank you for choosing [facility] to provide your oncology and hematology care.   If you have a lab appointment with the Cancer Center, please go directly to the Cancer Center and check in at the registration area.  Wear comfortable clothing and clothing appropriate for easy access to any Portacath or PICC line.   We strive to give you quality time with your provider. You may need to reschedule your appointment if you arrive late (15 or more minutes).  Arriving late affects you and other patients whose appointments are after yours.  Also, if you miss three or more appointments without notifying the office, you may be dismissed from the clinic at the provider's discretion.      For prescription refill requests, have your pharmacy contact our office and allow 72 hours for refills to be completed.    Today you received the following chemotherapy and/or immunotherapy agents faspro      To help prevent nausea and vomiting after your treatment, we encourage you to take your nausea medication as directed.  BELOW ARE SYMPTOMS THAT SHOULD BE REPORTED IMMEDIATELY: *FEVER GREATER THAN 100.4 F (38 C) OR HIGHER *CHILLS OR SWEATING *NAUSEA AND VOMITING THAT IS NOT CONTROLLED WITH YOUR NAUSEA MEDICATION *UNUSUAL SHORTNESS OF BREATH *UNUSUAL BRUISING OR BLEEDING *URINARY PROBLEMS (pain or burning when urinating, or frequent urination) *BOWEL PROBLEMS (unusual diarrhea, constipation, pain near the anus) TENDERNESS IN MOUTH AND THROAT WITH OR WITHOUT PRESENCE OF ULCERS (sore throat, sores in mouth, or a toothache) UNUSUAL RASH, SWELLING OR PAIN  UNUSUAL VAGINAL DISCHARGE OR ITCHING   Items with * indicate a potential emergency and should be followed up as soon as possible or go to the Emergency Department if any problems should occur.  Please show the CHEMOTHERAPY ALERT CARD or IMMUNOTHERAPY ALERT  CARD at check-in to the Emergency Department and triage nurse. Should you have questions after your visit or need to cancel or reschedule your appointment, please contact [facility] - A DEPT OF [facility]  [phone] and follow the prompts.  Office hours are 8:00 a.m. to 4:30 p.m. Monday - Friday. Please note that voicemails left after 4:00 p.m. may not be returned until the following business day.  We are closed weekends and major holidays. You have access to a nurse at all times for urgent questions. Please call the main number to the clinic [phone] and follow the prompts.  For any non-urgent questions, you may also contact your provider using MyChart. We now offer e-Visits for anyone 66 and older to request care online for non-urgent symptoms. For details visit [email or web address].   Also download the MyChart app! Go to the app store, search "MyChart", open the app, select , and log in with your MyChart username and password.

## 2024-04-13 ENCOUNTER — Inpatient Hospital Stay: Admitting: Hematology & Oncology

## 2024-04-13 ENCOUNTER — Inpatient Hospital Stay

## 2024-04-17 ENCOUNTER — Encounter: Payer: Self-pay | Admitting: *Deleted

## 2024-04-17 ENCOUNTER — Inpatient Hospital Stay

## 2024-04-17 ENCOUNTER — Inpatient Hospital Stay: Admitting: Hematology & Oncology

## 2024-04-17 VITALS — BP 122/71 | HR 79 | Temp 97.8°F | Resp 19 | Ht 70.0 in | Wt 229.0 lb

## 2024-04-17 VITALS — BP 129/80 | HR 71

## 2024-04-17 DIAGNOSIS — D472 Monoclonal gammopathy: Secondary | ICD-10-CM | POA: Diagnosis not present

## 2024-04-17 DIAGNOSIS — Z7984 Long term (current) use of oral hypoglycemic drugs: Secondary | ICD-10-CM | POA: Diagnosis not present

## 2024-04-17 DIAGNOSIS — Z79899 Other long term (current) drug therapy: Secondary | ICD-10-CM | POA: Diagnosis not present

## 2024-04-17 DIAGNOSIS — Z5112 Encounter for antineoplastic immunotherapy: Secondary | ICD-10-CM | POA: Diagnosis not present

## 2024-04-17 DIAGNOSIS — C9000 Multiple myeloma not having achieved remission: Secondary | ICD-10-CM

## 2024-04-17 DIAGNOSIS — R350 Frequency of micturition: Secondary | ICD-10-CM | POA: Diagnosis not present

## 2024-04-17 LAB — CMP (CANCER CENTER ONLY)
ALT: 27 U/L (ref 0–44)
AST: 28 U/L (ref 15–41)
Albumin: 4 g/dL (ref 3.5–5.0)
Alkaline Phosphatase: 59 U/L (ref 38–126)
Anion gap: 12 (ref 5–15)
BUN: 25 mg/dL — ABNORMAL HIGH (ref 8–23)
CO2: 23 mmol/L (ref 22–32)
Calcium: 9.6 mg/dL (ref 8.9–10.3)
Chloride: 100 mmol/L (ref 98–111)
Creatinine: 1.79 mg/dL — ABNORMAL HIGH (ref 0.61–1.24)
GFR, Estimated: 41 mL/min — ABNORMAL LOW (ref >60)
Glucose, Bld: 89 mg/dL (ref 70–99)
Potassium: 4.3 mmol/L (ref 3.5–5.1)
Sodium: 136 mmol/L (ref 135–145)
Total Bilirubin: 0.3 mg/dL (ref 0.0–1.2)
Total Protein: 6.5 g/dL (ref 6.5–8.1)

## 2024-04-17 LAB — CBC WITH DIFFERENTIAL (CANCER CENTER ONLY)
Abs Immature Granulocytes: 0.04 K/uL (ref 0.00–0.07)
Basophils Absolute: 0 K/uL (ref 0.0–0.1)
Basophils Relative: 0 %
Eosinophils Absolute: 0.3 K/uL (ref 0.0–0.5)
Eosinophils Relative: 4 %
HCT: 50.9 % (ref 39.0–52.0)
Hemoglobin: 17.5 g/dL — ABNORMAL HIGH (ref 13.0–17.0)
Immature Granulocytes: 1 %
Lymphocytes Relative: 16 %
Lymphs Abs: 1.3 K/uL (ref 0.7–4.0)
MCH: 32.9 pg (ref 26.0–34.0)
MCHC: 34.4 g/dL (ref 30.0–36.0)
MCV: 95.7 fL (ref 80.0–100.0)
Monocytes Absolute: 0.6 K/uL (ref 0.1–1.0)
Monocytes Relative: 7 %
Neutro Abs: 6.1 K/uL (ref 1.7–7.7)
Neutrophils Relative %: 72 %
Platelet Count: 237 K/uL (ref 150–400)
RBC: 5.32 MIL/uL (ref 4.22–5.81)
RDW: 14 % (ref 11.5–15.5)
WBC Count: 8.4 K/uL (ref 4.0–10.5)
nRBC: 0 % (ref 0.0–0.2)

## 2024-04-17 LAB — LACTATE DEHYDROGENASE: LDH: 204 U/L — ABNORMAL HIGH (ref 98–192)

## 2024-04-17 MED ORDER — DEXAMETHASONE 4 MG PO TABS: 20.0000 mg | ORAL_TABLET | Freq: Once | ORAL | Status: DC

## 2024-04-17 MED ORDER — DARATUMUMAB-HYALURONIDASE-FIHJ 1800-30000 MG-UT/15ML ~~LOC~~ SOLN
1800.0000 mg | Freq: Once | SUBCUTANEOUS | Status: AC
  Administered 2024-04-17 (×2): 1800 mg via SUBCUTANEOUS
  Filled 2024-04-17: qty 15

## 2024-04-17 MED ORDER — ACETAMINOPHEN 325 MG PO TABS: 650.0000 mg | ORAL_TABLET | Freq: Once | ORAL | Status: DC

## 2024-04-17 MED ORDER — DIPHENHYDRAMINE HCL 25 MG PO CAPS
50.0000 mg | ORAL_CAPSULE | Freq: Once | ORAL | Status: DC
Start: 2024-04-17 — End: 2024-04-17

## 2024-04-17 NOTE — Progress Notes (Signed)
 Hematology and Oncology Follow Up Visit  Kevin [name] [id number] [birth year] 68 y.o. 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 He is doing well and has no complaints at this time.  His kappa light chains had dropped significantly from 626 to 61 mg/L, M-spike was 0.5% and IgG level remained stable at 972.  He has had no issues so far with treatment.  He continues to have frequent urination.  No blood loss noted. No abnormal bruising, no petechiae.  No fever, chills, n/v, cough, rash, dizziness, SOB, chest pain, palpitations, abdominal pain or changes in bowel or bladder habits.  No swelling, tenderness, numbness or tingling in his extremities.  No falls or syncope.  Appetite and hydration are good. Weight is stable at 159 lbs.  He continues to stay active working out regularly.   ECOG Performance Status: 1 - Symptomatic but completely ambulatory  Medications:  Allergies as of 04/17/2024       Reactions   Nsaids Other (See Comments)   Avoids due to low kidney function, tolerates low dose aspirin         Medication List        Accurate as of April 17, 2024  8:08 AM. If you have any questions, ask your nurse or doctor.          aspirin EC 81 MG tablet Take 81 mg by mouth daily.   BENEFIBER PO Take by mouth daily.   [facility] Vitamin D3 250 MCG (10000 UT) Caps Generic drug: Cholecalciferol Take 10,000 Units by mouth daily.   dexamethasone  4 MG tablet Commonly known as: DECADRON  Take 5 tablets the morning of chemotherapy   Farxiga 10 MG Tabs tablet Generic drug: dapagliflozin propanediol Take 10 mg by mouth daily.   Fish Oil 500 MG Caps Take by mouth daily.   losartan 50 MG tablet Commonly known as: COZAAR Take 50 mg by mouth daily.   multivitamin tablet Take 1 tablet by mouth  daily.   ondansetron  8 MG tablet Commonly known as: Zofran  Take 1 tablet (8 mg total) by mouth every 8 (eight) hours as needed for nausea or vomiting.   QC TUMERIC COMPLEX PO Take 1,000 mg by mouth.   vitamin k 100 MCG tablet Take 100 mcg by mouth daily.        Allergies:  Allergies  Allergen Reactions   Nsaids Other (See Comments)    Avoids due to low kidney function, tolerates low dose aspirin     Past Medical History, Surgical history, Social history, and Family History were reviewed and updated.  Review of Systems: All other 10 point review of systems is negative.   Physical Exam:  height is 5' 10 (1.778 m) and weight is 229 lb (103.9 kg). His oral temperature is 97.8 F (36.6 C). His blood pressure is 122/71 and his pulse is 79. His respiration is 19 and oxygen saturation is 96%.   Wt Readings from Last 3 Encounters:  04/17/24 229 lb (103.9 kg)  03/20/24 226 lb 6.4 oz (102.7 kg)  02/09/24 215 lb 1.9 oz (97.6 kg)    Ocular: Sclerae unicteric, pupils equal, round and reactive to light Ear-nose-throat: Oropharynx clear, dentition fair Lymphatic: No cervical or supraclavicular adenopathy Lungs no rales or rhonchi, good excursion bilaterally Heart regular  rate and rhythm, no murmur appreciated Abd soft, nontender, positive bowel sounds MSK no focal spinal tenderness, no joint edema Neuro: non-focal, well-oriented, appropriate affect Breasts: Deferred   Lab Results  Component Value Date   WBC 8.4 04/17/2024   HGB 17.5 (H) 04/17/2024   HCT 50.9 04/17/2024   MCV 95.7 04/17/2024   PLT 237 04/17/2024   Lab Results  Component Value Date   FERRITIN 37 12/14/2023   IRON 152 12/14/2023   TIBC 360 12/14/2023   UIBC 208 12/14/2023   IRONPCTSAT 42 (H) 12/14/2023   Lab Results  Component Value Date   RETICCTPCT 1.1 12/14/2023   RBC 5.32 04/17/2024   Lab Results  Component Value Date   KPAFRELGTCHN 61.9 (H) 02/28/2024   LAMBDASER 7.2 02/28/2024    KAPLAMBRATIO 8.60 (H) 02/28/2024   Lab Results  Component Value Date   IGGSERUM 972 02/28/2024   IGGSERUM <30 (L) 02/28/2024   IGA 89 02/28/2024   IGA 89 02/28/2024   IGMSERUM 19 (L) 02/28/2024   IGMSERUM <5 (L) 02/28/2024   Lab Results  Component Value Date   TOTALPROTELP 6.1 02/28/2024   ALBUMINELP 3.3 02/28/2024   A1GS 0.2 02/28/2024   A2GS 0.8 02/28/2024   BETS 0.9 02/28/2024   GAMS 0.9 02/28/2024   MSPIKE 0.5 (H) 02/28/2024   SPEI Comment 12/14/2023     Chemistry      Component Value Date/Time   NA 135 03/20/2024 0804   K 4.6 03/20/2024 0804   CL 101 03/20/2024 0804   CO2 26 03/20/2024 0804   BUN 37 (H) 03/20/2024 0804   CREATININE 2.01 (H) 03/20/2024 0804      Component Value Date/Time   CALCIUM 9.7 03/20/2024 0804   ALKPHOS 51 03/20/2024 0804   AST 23 03/20/2024 0804   ALT 24 03/20/2024 0804   BILITOT 0.6 03/20/2024 0804       Impression and Plan: Kevin [name] is a pleasant 68 yo gentleman with 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  So far, he has tolerated treatment nicely and appears to having a good response.  We will proceed with treatment today as planned.  He has his current schedule through August. Follow-up with MD in 4 weeks.   Maude JONELLE Crease, MD 8/12/20258:08 AM

## 2024-04-17 NOTE — Progress Notes (Signed)
 Patient is tolerating treatment well. Labs show good response. He will continue with current treatment and proceed with cycle three today.  Oncology Nurse Navigator Documentation     04/17/2024    8:00 AM  Oncology Nurse Navigator Flowsheets  Navigator Follow Up Date: 05/29/2024  Navigator Follow Up Reason: Follow-up Appointment;Chemotherapy  Navigator Location [facility]  Navigator Encounter Type Treatment  Patient Visit Type MedOnc  Treatment Phase Active Tx  Barriers/Navigation Needs No Barriers At This Time  Interventions None Required  Acuity Level 1-No Barriers  Time Spent with Patient 15

## 2024-04-17 NOTE — Progress Notes (Signed)
 Hematology and Oncology Follow Up Visit  Kevin [name] [id number] 1955/11/04 68 y.o. 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  Overall, he is doing pretty well.  He really has had no toxicity from the Faspro.  He is working out.  He does a little bit.  He has had no problems with nausea or vomiting.  He has had no cough or shortness of breath.  He has had no change in bowel bladder habits.  He has had no rashes.  There is been no bleeding.  He has had no fever.  Overall, I would have said that his performance status is probably ECOG 0..   Medications:  Allergies as of 04/17/2024       Reactions   Nsaids Other (See Comments)   Avoids due to low kidney function, tolerates low dose aspirin         Medication List        Accurate as of April 17, 2024  8:21 AM. If you have any questions, ask your nurse or doctor.          aspirin EC 81 MG tablet Take 81 mg by mouth daily.   BENEFIBER PO Take by mouth daily.   CVS Vitamin D3 250 MCG (10000 UT) Caps Generic drug: Cholecalciferol Take 10,000 Units by mouth daily.   dexamethasone  4 MG tablet Commonly known as: DECADRON  Take 5 tablets the morning of chemotherapy   Farxiga 10 MG Tabs tablet Generic drug: dapagliflozin propanediol Take 10 mg by mouth daily.   Fish Oil 500 MG Caps Take by mouth daily.   losartan 50 MG tablet Commonly known as: COZAAR Take 50 mg by mouth daily.   multivitamin tablet Take 1 tablet by mouth daily.   ondansetron  8 MG tablet Commonly known as: Zofran  Take 1 tablet (8 mg total) by mouth every 8 (eight) hours as needed for nausea or vomiting.   QC TUMERIC COMPLEX PO Take 1,000 mg by mouth.   vitamin k 100 MCG tablet Take 100 mcg by mouth daily.        Allergies:  Allergies  Allergen Reactions   Nsaids Other (See Comments)     Avoids due to low kidney function, tolerates low dose aspirin     Past Medical History, Surgical history, Social history, and Family History were reviewed and updated.  Review of Systems:  Review of Systems  Constitutional: Negative.   HENT: Negative.    Eyes: Negative.   Respiratory: Negative.    Cardiovascular: Negative.   Gastrointestinal: Negative.   Genitourinary: Negative.   Musculoskeletal: Negative.   Skin: Negative.   Neurological: Negative.   Endo/Heme/Allergies: Negative.   Psychiatric/Behavioral: Negative.        Physical Exam:  height is 5' 10 (1.778 m) and weight is 229 lb (103.9 kg). His oral temperature is 97.8 F (36.6 C). His blood pressure is 122/71 and his pulse is 79. His respiration is 19 and oxygen saturation is 96%.   Wt Readings from Last 3 Encounters:  04/17/24 229 lb (103.9 kg)  03/20/24 226 lb 6.4 oz (102.7 kg)  02/09/24 215 lb 1.9 oz (97.6 kg)    Physical Exam Vitals reviewed.  HENT:     Head: Normocephalic and atraumatic.  Eyes:     Pupils: Pupils are equal, round, and  reactive to light.  Cardiovascular:     Rate and Rhythm: Normal rate and regular rhythm.     Heart sounds: Normal heart sounds.  Pulmonary:     Effort: Pulmonary effort is normal.     Breath sounds: Normal breath sounds.  Abdominal:     General: Bowel sounds are normal.     Palpations: Abdomen is soft.  Musculoskeletal:        General: No tenderness or deformity. Normal range of motion.     Cervical back: Normal range of motion.  Lymphadenopathy:     Cervical: No cervical adenopathy.  Skin:    General: Skin is warm and dry.     Findings: No erythema or rash.  Neurological:     Mental Status: He is alert and oriented to person, place, and time.  Psychiatric:        Behavior: Behavior normal.        Thought Content: Thought content normal.        Judgment: Judgment normal.      Lab Results  Component Value Date   WBC 8.4 04/17/2024   HGB 17.5 (H)  04/17/2024   HCT 50.9 04/17/2024   MCV 95.7 04/17/2024   PLT 237 04/17/2024   Lab Results  Component Value Date   FERRITIN 37 12/14/2023   IRON 152 12/14/2023   TIBC 360 12/14/2023   UIBC 208 12/14/2023   IRONPCTSAT 42 (H) 12/14/2023   Lab Results  Component Value Date   RETICCTPCT 1.1 12/14/2023   RBC 5.32 04/17/2024   Lab Results  Component Value Date   KPAFRELGTCHN 61.9 (H) 02/28/2024   LAMBDASER 7.2 02/28/2024   KAPLAMBRATIO 8.60 (H) 02/28/2024   Lab Results  Component Value Date   IGGSERUM 972 02/28/2024   IGGSERUM <30 (L) 02/28/2024   IGA 89 02/28/2024   IGA 89 02/28/2024   IGMSERUM 19 (L) 02/28/2024   IGMSERUM <5 (L) 02/28/2024   Lab Results  Component Value Date   TOTALPROTELP 6.1 02/28/2024   ALBUMINELP 3.3 02/28/2024   A1GS 0.2 02/28/2024   A2GS 0.8 02/28/2024   BETS 0.9 02/28/2024   GAMS 0.9 02/28/2024   MSPIKE 0.5 (H) 02/28/2024   SPEI Comment 12/14/2023     Chemistry      Component Value Date/Time   NA 135 03/20/2024 0804   K 4.6 03/20/2024 0804   CL 101 03/20/2024 0804   CO2 26 03/20/2024 0804   BUN 37 (H) 03/20/2024 0804   CREATININE 2.01 (H) 03/20/2024 0804      Component Value Date/Time   CALCIUM 9.7 03/20/2024 0804   ALKPHOS 51 03/20/2024 0804   AST 23 03/20/2024 0804   ALT 24 03/20/2024 0804   BILITOT 0.6 03/20/2024 0804       Impression and Plan: Kevin [name] is a pleasant 68 yo gentleman with 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   I do believe that he is responding nicely.  We will have to see what his myeloma studies look like.  We now treated every other week.  Find that he is responding nicely, would not be able to hold on the Faspro in the near future.  I will let him go back to see us  in another 6 weeks.  Again, he will be treated every 2 weeks for right now. SABRA Maude JONELLE Timmy, MD 8/12/20258:21 AM

## 2024-04-17 NOTE — Patient Instructions (Signed)
 [facility] - A DEPT OF [facility]  Discharge Instructions: Thank you for choosing [facility] to provide your oncology and hematology care.   If you have a lab appointment with the Cancer Center, please go directly to the Cancer Center and check in at the registration area.  Wear comfortable clothing and clothing appropriate for easy access to any Portacath or PICC line.   We strive to give you quality time with your provider. You may need to reschedule your appointment if you arrive late (15 or more minutes).  Arriving late affects you and other patients whose appointments are after yours.  Also, if you miss three or more appointments without notifying the office, you may be dismissed from the clinic at the provider's discretion.      For prescription refill requests, have your pharmacy contact our office and allow 72 hours for refills to be completed.    Today you received the following chemotherapy and/or immunotherapy agents Darzalex       To help prevent nausea and vomiting after your treatment, we encourage you to take your nausea medication as directed.  BELOW ARE SYMPTOMS THAT SHOULD BE REPORTED IMMEDIATELY: *FEVER GREATER THAN 100.4 F (38 C) OR HIGHER *CHILLS OR SWEATING *NAUSEA AND VOMITING THAT IS NOT CONTROLLED WITH YOUR NAUSEA MEDICATION *UNUSUAL SHORTNESS OF BREATH *UNUSUAL BRUISING OR BLEEDING *URINARY PROBLEMS (pain or burning when urinating, or frequent urination) *BOWEL PROBLEMS (unusual diarrhea, constipation, pain near the anus) TENDERNESS IN MOUTH AND THROAT WITH OR WITHOUT PRESENCE OF ULCERS (sore throat, sores in mouth, or a toothache) UNUSUAL RASH, SWELLING OR PAIN  UNUSUAL VAGINAL DISCHARGE OR ITCHING   Items with * indicate a potential emergency and should be followed up as soon as possible or go to the Emergency Department if any problems should occur.  Please show the CHEMOTHERAPY ALERT CARD or IMMUNOTHERAPY  ALERT CARD at check-in to the Emergency Department and triage nurse. Should you have questions after your visit or need to cancel or reschedule your appointment, please contact [facility] - A DEPT OF [facility]  [phone] and follow the prompts.  Office hours are 8:00 a.m. to 4:30 p.m. Monday - Friday. Please note that voicemails left after 4:00 p.m. may not be returned until the following business day.  We are closed weekends and major holidays. You have access to a nurse at all times for urgent questions. Please call the main number to the clinic [phone] and follow the prompts.  For any non-urgent questions, you may also contact your provider using MyChart. We now offer e-Visits for anyone 11 and older to request care online for non-urgent symptoms. For details visit [email or web address].   Also download the MyChart app! Go to the app store, search MyChart, open the app, select , and log in with your MyChart username and password.

## 2024-04-18 LAB — IGG, IGA, IGM
IgA: 51 mg/dL — ABNORMAL LOW (ref 61–437)
IgG (Immunoglobin G), Serum: 753 mg/dL (ref 603–1613)
IgM (Immunoglobulin M), Srm: 15 mg/dL — ABNORMAL LOW (ref 20–172)

## 2024-04-18 LAB — KAPPA/LAMBDA LIGHT CHAINS
Kappa free light chain: 39.7 mg/L — ABNORMAL HIGH (ref 3.3–19.4)
Kappa, lambda light chain ratio: 6.84 — ABNORMAL HIGH (ref 0.26–1.65)
Lambda free light chains: 5.8 mg/L (ref 5.7–26.3)

## 2024-04-19 ENCOUNTER — Encounter: Payer: Self-pay | Admitting: Hematology & Oncology

## 2024-04-19 LAB — PROTEIN ELECTROPHORESIS, SERUM
A/G Ratio: 1.1 (ref 0.7–1.7)
Albumin ELP: 3.2 g/dL (ref 2.9–4.4)
Alpha-1-Globulin: 0.2 g/dL (ref 0.0–0.4)
Alpha-2-Globulin: 1 g/dL (ref 0.4–1.0)
Beta Globulin: 0.9 g/dL (ref 0.7–1.3)
Gamma Globulin: 0.7 g/dL (ref 0.4–1.8)
Globulin, Total: 2.9 g/dL (ref 2.2–3.9)
M-Spike, %: 0.4 g/dL — ABNORMAL HIGH
Total Protein ELP: 6.1 g/dL (ref 6.0–8.5)

## 2024-04-20 ENCOUNTER — Ambulatory Visit: Payer: Self-pay

## 2024-04-20 ENCOUNTER — Other Ambulatory Visit: Payer: Self-pay

## 2024-04-27 ENCOUNTER — Inpatient Hospital Stay

## 2024-05-01 ENCOUNTER — Inpatient Hospital Stay

## 2024-05-01 VITALS — BP 133/83 | HR 67 | Temp 98.1°F | Resp 20

## 2024-05-01 DIAGNOSIS — D472 Monoclonal gammopathy: Secondary | ICD-10-CM

## 2024-05-01 DIAGNOSIS — R350 Frequency of micturition: Secondary | ICD-10-CM | POA: Diagnosis not present

## 2024-05-01 DIAGNOSIS — C9000 Multiple myeloma not having achieved remission: Secondary | ICD-10-CM | POA: Diagnosis not present

## 2024-05-01 DIAGNOSIS — Z7984 Long term (current) use of oral hypoglycemic drugs: Secondary | ICD-10-CM | POA: Diagnosis not present

## 2024-05-01 DIAGNOSIS — Z79899 Other long term (current) drug therapy: Secondary | ICD-10-CM | POA: Diagnosis not present

## 2024-05-01 DIAGNOSIS — Z5112 Encounter for antineoplastic immunotherapy: Secondary | ICD-10-CM | POA: Diagnosis not present

## 2024-05-01 LAB — CBC WITH DIFFERENTIAL (CANCER CENTER ONLY)
Abs Immature Granulocytes: 0.04 K/uL (ref 0.00–0.07)
Basophils Absolute: 0 K/uL (ref 0.0–0.1)
Basophils Relative: 0 %
Eosinophils Absolute: 0.7 K/uL — ABNORMAL HIGH (ref 0.0–0.5)
Eosinophils Relative: 7 %
HCT: 49.3 % (ref 39.0–52.0)
Hemoglobin: 17.2 g/dL — ABNORMAL HIGH (ref 13.0–17.0)
Immature Granulocytes: 0 %
Lymphocytes Relative: 12 %
Lymphs Abs: 1.1 K/uL (ref 0.7–4.0)
MCH: 33.7 pg (ref 26.0–34.0)
MCHC: 34.9 g/dL (ref 30.0–36.0)
MCV: 96.5 fL (ref 80.0–100.0)
Monocytes Absolute: 0.8 K/uL (ref 0.1–1.0)
Monocytes Relative: 9 %
Neutro Abs: 6.4 K/uL (ref 1.7–7.7)
Neutrophils Relative %: 72 %
Platelet Count: 275 K/uL (ref 150–400)
RBC: 5.11 MIL/uL (ref 4.22–5.81)
RDW: 13.5 % (ref 11.5–15.5)
WBC Count: 9 K/uL (ref 4.0–10.5)
nRBC: 0 % (ref 0.0–0.2)

## 2024-05-01 MED ORDER — ACETAMINOPHEN 325 MG PO TABS
650.0000 mg | ORAL_TABLET | Freq: Once | ORAL | Status: DC
Start: 2024-05-01 — End: 2024-05-01

## 2024-05-01 MED ORDER — DIPHENHYDRAMINE HCL 25 MG PO CAPS
50.0000 mg | ORAL_CAPSULE | Freq: Once | ORAL | Status: DC
Start: 2024-05-01 — End: 2024-05-01

## 2024-05-01 MED ORDER — DARATUMUMAB-HYALURONIDASE-FIHJ 1800-30000 MG-UT/15ML ~~LOC~~ SOLN
1800.0000 mg | Freq: Once | SUBCUTANEOUS | Status: AC
  Administered 2024-05-01: 1800 mg via SUBCUTANEOUS
  Filled 2024-05-01: qty 15

## 2024-05-01 MED ORDER — DEXAMETHASONE 4 MG PO TABS: 20.0000 mg | ORAL_TABLET | Freq: Once | ORAL | Status: DC

## 2024-05-01 NOTE — Patient Instructions (Signed)
 [facility]  Discharge Instructions: Thank you for choosing [facility] to provide your oncology and hematology care.   If you have a lab appointment with the Cancer Center, please go directly to the Cancer Center and check in at the registration area.  Wear comfortable clothing and clothing appropriate for easy access to any Portacath or PICC line.   We strive to give you quality time with your provider. You may need to reschedule your appointment if you arrive late (15 or more minutes).  Arriving late affects you and other patients whose appointments are after yours.  Also, if you miss three or more appointments without notifying the office, you may be dismissed from the clinic at the provider's discretion.      For prescription refill requests, have your pharmacy contact our office and allow 72 hours for refills to be completed.    Today you received the following chemotherapy and/or immunotherapy agents:  Faspro      To help prevent nausea and vomiting after your treatment, we encourage you to take your nausea medication as directed.  BELOW ARE SYMPTOMS THAT SHOULD BE REPORTED IMMEDIATELY: *FEVER GREATER THAN 100.4 F (38 C) OR HIGHER *CHILLS OR SWEATING *NAUSEA AND VOMITING THAT IS NOT CONTROLLED WITH YOUR NAUSEA MEDICATION *UNUSUAL SHORTNESS OF BREATH *UNUSUAL BRUISING OR BLEEDING *URINARY PROBLEMS (pain or burning when urinating, or frequent urination) *BOWEL PROBLEMS (unusual diarrhea, constipation, pain near the anus) TENDERNESS IN MOUTH AND THROAT WITH OR WITHOUT PRESENCE OF ULCERS (sore throat, sores in mouth, or a toothache) UNUSUAL RASH, SWELLING OR PAIN  UNUSUAL VAGINAL DISCHARGE OR ITCHING   Items with * indicate a potential emergency and should be followed up as soon as possible or go to the Emergency Department if any problems should occur.  Please show the CHEMOTHERAPY ALERT CARD or IMMUNOTHERAPY  ALERT CARD at check-in to the Emergency Department and triage nurse. Should you have questions after your visit or need to cancel or reschedule your appointment, please contact [facility] - A DEPT OF [facility]  [phone] and follow the prompts.  Office hours are 8:00 a.m. to 4:30 p.m. Monday - Friday. Please note that voicemails left after 4:00 p.m. may not be returned until the following business day.  We are closed weekends and major holidays. You have access to a nurse at all times for urgent questions. Please call the main number to the clinic [phone] and follow the prompts.  For any non-urgent questions, you may also contact your provider using MyChart. We now offer e-Visits for anyone 66 and older to request care online for non-urgent symptoms. For details visit [email or web address].   Also download the MyChart app! Go to the app store, search "MyChart", open the app, select [place], and log in with your MyChart username and password.

## 2024-05-14 ENCOUNTER — Other Ambulatory Visit: Payer: Self-pay | Admitting: *Deleted

## 2024-05-14 DIAGNOSIS — C9000 Multiple myeloma not having achieved remission: Secondary | ICD-10-CM

## 2024-05-14 DIAGNOSIS — D472 Monoclonal gammopathy: Secondary | ICD-10-CM

## 2024-05-15 ENCOUNTER — Other Ambulatory Visit: Payer: Self-pay | Admitting: Hematology & Oncology

## 2024-05-15 ENCOUNTER — Inpatient Hospital Stay

## 2024-05-15 ENCOUNTER — Inpatient Hospital Stay: Attending: Hematology & Oncology

## 2024-05-15 VITALS — BP 143/68 | HR 74 | Temp 98.3°F | Resp 18

## 2024-05-15 DIAGNOSIS — C9000 Multiple myeloma not having achieved remission: Secondary | ICD-10-CM | POA: Insufficient documentation

## 2024-05-15 DIAGNOSIS — D472 Monoclonal gammopathy: Secondary | ICD-10-CM

## 2024-05-15 DIAGNOSIS — Z5112 Encounter for antineoplastic immunotherapy: Secondary | ICD-10-CM | POA: Insufficient documentation

## 2024-05-15 LAB — COMPREHENSIVE METABOLIC PANEL WITH GFR
ALT: 30 U/L (ref 0–44)
AST: 28 U/L (ref 15–41)
Albumin: 4.3 g/dL (ref 3.5–5.0)
Alkaline Phosphatase: 62 U/L (ref 38–126)
Anion gap: 11 (ref 5–15)
BUN: 35 mg/dL — ABNORMAL HIGH (ref 8–23)
CO2: 26 mmol/L (ref 22–32)
Calcium: 10 mg/dL (ref 8.9–10.3)
Chloride: 99 mmol/L (ref 98–111)
Creatinine, Ser: 1.96 mg/dL — ABNORMAL HIGH (ref 0.61–1.24)
GFR, Estimated: 37 mL/min — ABNORMAL LOW (ref >60)
Glucose, Bld: 84 mg/dL (ref 70–99)
Potassium: 5.1 mmol/L (ref 3.5–5.1)
Sodium: 136 mmol/L (ref 135–145)
Total Bilirubin: 0.4 mg/dL (ref 0.0–1.2)
Total Protein: 6.7 g/dL (ref 6.5–8.1)

## 2024-05-15 LAB — CBC WITH DIFFERENTIAL (CANCER CENTER ONLY)
Abs Immature Granulocytes: 0.05 K/uL (ref 0.00–0.07)
Basophils Absolute: 0.1 K/uL (ref 0.0–0.1)
Basophils Relative: 1 %
Eosinophils Absolute: 0.1 K/uL (ref 0.0–0.5)
Eosinophils Relative: 1 %
HCT: 53.1 % — ABNORMAL HIGH (ref 39.0–52.0)
Hemoglobin: 18.3 g/dL — ABNORMAL HIGH (ref 13.0–17.0)
Immature Granulocytes: 1 %
Lymphocytes Relative: 11 %
Lymphs Abs: 1.2 K/uL (ref 0.7–4.0)
MCH: 33 pg (ref 26.0–34.0)
MCHC: 34.5 g/dL (ref 30.0–36.0)
MCV: 95.7 fL (ref 80.0–100.0)
Monocytes Absolute: 0.8 K/uL (ref 0.1–1.0)
Monocytes Relative: 7 %
Neutro Abs: 8.4 K/uL — ABNORMAL HIGH (ref 1.7–7.7)
Neutrophils Relative %: 79 %
Platelet Count: 259 K/uL (ref 150–400)
RBC: 5.55 MIL/uL (ref 4.22–5.81)
RDW: 13.3 % (ref 11.5–15.5)
WBC Count: 10.6 K/uL — ABNORMAL HIGH (ref 4.0–10.5)
nRBC: 0 % (ref 0.0–0.2)

## 2024-05-15 MED ORDER — DEXAMETHASONE 4 MG PO TABS: 20.0000 mg | ORAL_TABLET | Freq: Once | ORAL | Status: DC

## 2024-05-15 MED ORDER — DIPHENHYDRAMINE HCL 25 MG PO CAPS: 50.0000 mg | ORAL_CAPSULE | Freq: Once | ORAL | Status: DC

## 2024-05-15 MED ORDER — ACETAMINOPHEN 325 MG PO TABS: 650.0000 mg | ORAL_TABLET | Freq: Once | ORAL | Status: DC

## 2024-05-15 MED ORDER — DARATUMUMAB-HYALURONIDASE-FIHJ 1800-30000 MG-UT/15ML ~~LOC~~ SOLN
1800.0000 mg | Freq: Once | SUBCUTANEOUS | Status: AC
  Administered 2024-05-15: 1800 mg via SUBCUTANEOUS
  Filled 2024-05-15: qty 15

## 2024-05-15 NOTE — Progress Notes (Signed)
 OK to treat with creat-1.96 per order of Dr. Timmy.

## 2024-05-15 NOTE — Patient Instructions (Signed)
 [facility]  Discharge Instructions: Thank you for choosing [facility] to provide your oncology and hematology care.   If you have a lab appointment with the Cancer Center, please go directly to the Cancer Center and check in at the registration area.  Wear comfortable clothing and clothing appropriate for easy access to any Portacath or PICC line.   We strive to give you quality time with your provider. You may need to reschedule your appointment if you arrive late (15 or more minutes).  Arriving late affects you and other patients whose appointments are after yours.  Also, if you miss three or more appointments without notifying the office, you may be dismissed from the clinic at the provider's discretion.      For prescription refill requests, have your pharmacy contact our office and allow 72 hours for refills to be completed.    Today you received the following chemotherapy and/or immunotherapy agents:  Faspro      To help prevent nausea and vomiting after your treatment, we encourage you to take your nausea medication as directed.  BELOW ARE SYMPTOMS THAT SHOULD BE REPORTED IMMEDIATELY: *FEVER GREATER THAN 100.4 F (38 C) OR HIGHER *CHILLS OR SWEATING *NAUSEA AND VOMITING THAT IS NOT CONTROLLED WITH YOUR NAUSEA MEDICATION *UNUSUAL SHORTNESS OF BREATH *UNUSUAL BRUISING OR BLEEDING *URINARY PROBLEMS (pain or burning when urinating, or frequent urination) *BOWEL PROBLEMS (unusual diarrhea, constipation, pain near the anus) TENDERNESS IN MOUTH AND THROAT WITH OR WITHOUT PRESENCE OF ULCERS (sore throat, sores in mouth, or a toothache) UNUSUAL RASH, SWELLING OR PAIN  UNUSUAL VAGINAL DISCHARGE OR ITCHING   Items with * indicate a potential emergency and should be followed up as soon as possible or go to the Emergency Department if any problems should occur.  Please show the CHEMOTHERAPY ALERT CARD or IMMUNOTHERAPY  ALERT CARD at check-in to the Emergency Department and triage nurse. Should you have questions after your visit or need to cancel or reschedule your appointment, please contact [facility] - A DEPT OF [facility]  [phone] and follow the prompts.  Office hours are 8:00 a.m. to 4:30 p.m. Monday - Friday. Please note that voicemails left after 4:00 p.m. may not be returned until the following business day.  We are closed weekends and major holidays. You have access to a nurse at all times for urgent questions. Please call the main number to the clinic [phone] and follow the prompts.  For any non-urgent questions, you may also contact your provider using MyChart. We now offer e-Visits for anyone 66 and older to request care online for non-urgent symptoms. For details visit [email or web address].   Also download the MyChart app! Go to the app store, search "MyChart", open the app, select [place], and log in with your MyChart username and password.

## 2024-05-29 ENCOUNTER — Inpatient Hospital Stay (HOSPITAL_BASED_OUTPATIENT_CLINIC_OR_DEPARTMENT_OTHER): Admitting: Hematology & Oncology

## 2024-05-29 ENCOUNTER — Encounter: Payer: Self-pay | Admitting: Hematology & Oncology

## 2024-05-29 ENCOUNTER — Encounter: Payer: Self-pay | Admitting: *Deleted

## 2024-05-29 ENCOUNTER — Inpatient Hospital Stay

## 2024-05-29 VITALS — BP 149/80 | HR 75 | Temp 97.8°F | Resp 18 | Ht 70.0 in | Wt 237.8 lb

## 2024-05-29 DIAGNOSIS — D472 Monoclonal gammopathy: Secondary | ICD-10-CM | POA: Diagnosis not present

## 2024-05-29 DIAGNOSIS — Z5112 Encounter for antineoplastic immunotherapy: Secondary | ICD-10-CM | POA: Diagnosis not present

## 2024-05-29 LAB — CBC WITH DIFFERENTIAL (CANCER CENTER ONLY)
Abs Immature Granulocytes: 0.04 K/uL (ref 0.00–0.07)
Basophils Absolute: 0.1 K/uL (ref 0.0–0.1)
Basophils Relative: 1 %
Eosinophils Absolute: 0.1 K/uL (ref 0.0–0.5)
Eosinophils Relative: 1 %
HCT: 50.3 % (ref 39.0–52.0)
Hemoglobin: 17.7 g/dL — ABNORMAL HIGH (ref 13.0–17.0)
Immature Granulocytes: 1 %
Lymphocytes Relative: 15 %
Lymphs Abs: 1.2 K/uL (ref 0.7–4.0)
MCH: 33.3 pg (ref 26.0–34.0)
MCHC: 35.2 g/dL (ref 30.0–36.0)
MCV: 94.5 fL (ref 80.0–100.0)
Monocytes Absolute: 0.6 K/uL (ref 0.1–1.0)
Monocytes Relative: 8 %
Neutro Abs: 5.8 K/uL (ref 1.7–7.7)
Neutrophils Relative %: 74 %
Platelet Count: 236 K/uL (ref 150–400)
RBC: 5.32 MIL/uL (ref 4.22–5.81)
RDW: 13.1 % (ref 11.5–15.5)
WBC Count: 7.8 K/uL (ref 4.0–10.5)
nRBC: 0 % (ref 0.0–0.2)

## 2024-05-29 LAB — CMP (CANCER CENTER ONLY)
ALT: 32 U/L (ref 0–44)
AST: 34 U/L (ref 15–41)
Albumin: 4 g/dL (ref 3.5–5.0)
Alkaline Phosphatase: 65 U/L (ref 38–126)
Anion gap: 13 (ref 5–15)
BUN: 25 mg/dL — ABNORMAL HIGH (ref 8–23)
CO2: 23 mmol/L (ref 22–32)
Calcium: 9.1 mg/dL (ref 8.9–10.3)
Chloride: 100 mmol/L (ref 98–111)
Creatinine: 1.89 mg/dL — ABNORMAL HIGH (ref 0.61–1.24)
GFR, Estimated: 38 mL/min — ABNORMAL LOW (ref >60)
Glucose, Bld: 87 mg/dL (ref 70–99)
Potassium: 4.4 mmol/L (ref 3.5–5.1)
Sodium: 136 mmol/L (ref 135–145)
Total Bilirubin: 0.4 mg/dL (ref 0.0–1.2)
Total Protein: 6.4 g/dL — ABNORMAL LOW (ref 6.5–8.1)

## 2024-05-29 LAB — LACTATE DEHYDROGENASE: LDH: 215 U/L — ABNORMAL HIGH (ref 98–192)

## 2024-05-29 MED ORDER — DEXAMETHASONE 4 MG PO TABS: 20.0000 mg | ORAL_TABLET | Freq: Once | ORAL | Status: DC

## 2024-05-29 MED ORDER — ACETAMINOPHEN 325 MG PO TABS: 650.0000 mg | ORAL_TABLET | Freq: Once | ORAL | Status: DC

## 2024-05-29 MED ORDER — DIPHENHYDRAMINE HCL 25 MG PO CAPS: 50.0000 mg | ORAL_CAPSULE | Freq: Once | ORAL | Status: DC

## 2024-05-29 MED ORDER — DARATUMUMAB-HYALURONIDASE-FIHJ 1800-30000 MG-UT/15ML ~~LOC~~ SOLN
1800.0000 mg | Freq: Once | SUBCUTANEOUS | Status: AC
  Administered 2024-05-29: 1800 mg via SUBCUTANEOUS
  Filled 2024-05-29: qty 15

## 2024-05-29 NOTE — Patient Instructions (Addendum)
 [facility] - A DEPT OF [facility]  Discharge Instructions: Thank you for choosing [facility] to provide your oncology and hematology care.   If you have a lab appointment with the Cancer Center, please go directly to the Cancer Center and check in at the registration area.  Wear comfortable clothing and clothing appropriate for easy access to any Portacath or PICC line.   We strive to give you quality time with your provider. You may need to reschedule your appointment if you arrive late (15 or more minutes).  Arriving late affects you and other patients whose appointments are after yours.  Also, if you miss three or more appointments without notifying the office, you may be dismissed from the clinic at the provider's discretion.      For prescription refill requests, have your pharmacy contact our office and allow 72 hours for refills to be completed.    Today you received the following chemotherapy and/or immunotherapy agents:  Faspro      To help prevent nausea and vomiting after your treatment, we encourage you to take your nausea medication as directed.  BELOW ARE SYMPTOMS THAT SHOULD BE REPORTED IMMEDIATELY: *FEVER GREATER THAN 100.4 F (38 C) OR HIGHER *CHILLS OR SWEATING *NAUSEA AND VOMITING THAT IS NOT CONTROLLED WITH YOUR NAUSEA MEDICATION *UNUSUAL SHORTNESS OF BREATH *UNUSUAL BRUISING OR BLEEDING *URINARY PROBLEMS (pain or burning when urinating, or frequent urination) *BOWEL PROBLEMS (unusual diarrhea, constipation, pain near the anus) TENDERNESS IN MOUTH AND THROAT WITH OR WITHOUT PRESENCE OF ULCERS (sore throat, sores in mouth, or a toothache) UNUSUAL RASH, SWELLING OR PAIN  UNUSUAL VAGINAL DISCHARGE OR ITCHING   Items with * indicate a potential emergency and should be followed up as soon as possible or go to the Emergency Department if any problems should occur.  Please show the CHEMOTHERAPY ALERT CARD or IMMUNOTHERAPY  ALERT CARD at check-in to the Emergency Department and triage nurse. Should you have questions after your visit or need to cancel or reschedule your appointment, please contact [facility] - A DEPT OF [facility]  [phone] and follow the prompts.  Office hours are 8:00 a.m. to 4:30 p.m. Monday - Friday. Please note that voicemails left after 4:00 p.m. may not be returned until the following business day.  We are closed weekends and major holidays. You have access to a nurse at all times for urgent questions. Please call the main number to the clinic [phone] and follow the prompts.  For any non-urgent questions, you may also contact your provider using MyChart. We now offer e-Visits for anyone 66 and older to request care online for non-urgent symptoms. For details visit [email or web address].   Also download the MyChart app! Go to the app store, search "MyChart", open the app, select [place], and log in with your MyChart username and password.

## 2024-05-29 NOTE — Progress Notes (Unsigned)
 Patient is doing very well on treatment. He will continue with cycle four.   Oncology Nurse Navigator Documentation     05/29/2024    8:00 AM  Oncology Nurse Navigator Flowsheets  Navigator Follow Up Date: 06/12/2024  Navigator Follow Up Reason: Follow-up Appointment;Chemotherapy  Navigator Location [facility]  Navigator Encounter Type Treatment  Patient Visit Type MedOnc  Treatment Phase Active Tx  Barriers/Navigation Needs No Barriers At This Time  Interventions None Required  Acuity Level 1-No Barriers  Time Spent with Patient 15

## 2024-05-29 NOTE — Progress Notes (Signed)
 Hematology and Oncology Follow Up Visit  Kevin [name] [id number] 1956-06-02 68 y.o. 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  As expected, he has responded very very nicely.  When we first started treatment, his kappa light chain was 627 mg/L.  When we last checked, it was 40 mg/L.  Again he has responded very well.  He has had no complications.  He has had no nausea or vomiting.  He has had no change in bowel or bladder habits.  He has had no fever.  He has had no rashes.  He is still exercising.  He is still trying to stay active.  Overall, I will say his performance status is probably ECOG 0.  .   Medications:  Allergies as of 05/29/2024       Reactions   Nsaids Other (See Comments)   Avoids due to low kidney function, tolerates low dose aspirin         Medication List        Accurate as of May 29, 2024  8:20 AM. If you have any questions, ask your nurse or doctor.          aspirin EC 81 MG tablet Take 81 mg by mouth daily.   BENEFIBER PO Take by mouth daily.   CVS Vitamin D3 250 MCG (10000 UT) Caps Generic drug: Cholecalciferol Take 10,000 Units by mouth daily.   dexamethasone  4 MG tablet Commonly known as: DECADRON  Take 5 tablets the morning of chemotherapy   Farxiga 10 MG Tabs tablet Generic drug: dapagliflozin propanediol Take 10 mg by mouth daily.   Fish Oil 500 MG Caps Take by mouth daily.   losartan 50 MG tablet Commonly known as: COZAAR Take 50 mg by mouth daily.   multivitamin tablet Take 1 tablet by mouth daily.   ondansetron  8 MG tablet Commonly known as: Zofran  Take 1 tablet (8 mg total) by mouth every 8 (eight) hours as needed for nausea or vomiting.   QC TUMERIC COMPLEX PO Take 1,000 mg by mouth.   vitamin k 100 MCG tablet Take 100 mcg by mouth daily.         Allergies:  Allergies  Allergen Reactions   Nsaids Other (See Comments)    Avoids due to low kidney function, tolerates low dose aspirin     Past Medical History, Surgical history, Social history, and Family History were reviewed and updated.  Review of Systems:  Review of Systems  Constitutional: Negative.   HENT: Negative.    Eyes: Negative.   Respiratory: Negative.    Cardiovascular: Negative.   Gastrointestinal: Negative.   Genitourinary: Negative.   Musculoskeletal: Negative.   Skin: Negative.   Neurological: Negative.   Endo/Heme/Allergies: Negative.   Psychiatric/Behavioral: Negative.        Physical Exam:  height is 5' 10 (1.778 m) and weight is 237 lb 12.8 oz (107.9 kg). His oral temperature is 97.8 F (36.6 C). His blood pressure is 149/80 (abnormal) and his pulse is 75. His respiration is 18 and oxygen saturation is 98%.   Wt Readings from Last 3 Encounters:  05/29/24 237 lb 12.8 oz (107.9 kg)  04/17/24 229 lb (103.9 kg)  03/20/24 226 lb 6.4 oz (102.7 kg)    Physical Exam Vitals reviewed.  HENT:     Head:  Normocephalic and atraumatic.  Eyes:     Pupils: Pupils are equal, round, and reactive to light.  Cardiovascular:     Rate and Rhythm: Normal rate and regular rhythm.     Heart sounds: Normal heart sounds.  Pulmonary:     Effort: Pulmonary effort is normal.     Breath sounds: Normal breath sounds.  Abdominal:     General: Bowel sounds are normal.     Palpations: Abdomen is soft.  Musculoskeletal:        General: No tenderness or deformity. Normal range of motion.     Cervical back: Normal range of motion.  Lymphadenopathy:     Cervical: No cervical adenopathy.  Skin:    General: Skin is warm and dry.     Findings: No erythema or rash.  Neurological:     Mental Status: He is alert and oriented to person, place, and time.  Psychiatric:        Behavior: Behavior normal.        Thought Content: Thought content normal.        Judgment:  Judgment normal.      Lab Results  Component Value Date   WBC 7.8 05/29/2024   HGB 17.7 (H) 05/29/2024   HCT 50.3 05/29/2024   MCV 94.5 05/29/2024   PLT 236 05/29/2024   Lab Results  Component Value Date   FERRITIN 37 12/14/2023   IRON 152 12/14/2023   TIBC 360 12/14/2023   UIBC 208 12/14/2023   IRONPCTSAT 42 (H) 12/14/2023   Lab Results  Component Value Date   RETICCTPCT 1.1 12/14/2023   RBC 5.32 05/29/2024   Lab Results  Component Value Date   KPAFRELGTCHN 39.7 (H) 04/17/2024   LAMBDASER 5.8 04/17/2024   KAPLAMBRATIO 6.84 (H) 04/17/2024   Lab Results  Component Value Date   IGGSERUM 753 04/17/2024   IGA 51 (L) 04/17/2024   IGMSERUM 15 (L) 04/17/2024   Lab Results  Component Value Date   TOTALPROTELP 6.1 04/17/2024   ALBUMINELP 3.2 04/17/2024   A1GS 0.2 04/17/2024   A2GS 1.0 04/17/2024   BETS 0.9 04/17/2024   GAMS 0.7 04/17/2024   MSPIKE 0.4 (H) 04/17/2024   SPEI Comment 04/17/2024     Chemistry      Component Value Date/Time   NA 136 05/15/2024 0748   K 5.1 05/15/2024 0748   CL 99 05/15/2024 0748   CO2 26 05/15/2024 0748   BUN 35 (H) 05/15/2024 0748   CREATININE 1.96 (H) 05/15/2024 0748   CREATININE 1.79 (H) 04/17/2024 0747      Component Value Date/Time   CALCIUM 10.0 05/15/2024 0748   ALKPHOS 62 05/15/2024 0748   AST 28 05/15/2024 0748   AST 28 04/17/2024 0747   ALT 30 05/15/2024 0748   ALT 27 04/17/2024 0747   BILITOT 0.4 05/15/2024 0748   BILITOT 0.3 04/17/2024 0747       Impression and Plan: Kevin [name] is a pleasant 68 yo gentleman with 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   We will have to see what his myeloma studies are at this time.  Again I have to believe that the light chains are further down.  More importantly, it is the fact that he is tolerating treatment very nicely.  We will plan to get him back to see us  in another month.  He will get treated in 2 weeks.   Kevin JONELLE Crease, MD 9/23/20258:20 AM

## 2024-05-30 ENCOUNTER — Encounter: Payer: Self-pay | Admitting: *Deleted

## 2024-05-30 ENCOUNTER — Ambulatory Visit: Payer: Self-pay | Admitting: Hematology & Oncology

## 2024-05-30 LAB — KAPPA/LAMBDA LIGHT CHAINS
Kappa free light chain: 35.4 mg/L — ABNORMAL HIGH (ref 3.3–19.4)
Kappa, lambda light chain ratio: 5.36 — ABNORMAL HIGH (ref 0.26–1.65)
Lambda free light chains: 6.6 mg/L (ref 5.7–26.3)

## 2024-05-31 ENCOUNTER — Encounter: Payer: Self-pay | Admitting: Hematology & Oncology

## 2024-05-31 LAB — IGG, IGA, IGM
IgA: 52 mg/dL — ABNORMAL LOW (ref 61–437)
IgG (Immunoglobin G), Serum: 678 mg/dL (ref 603–1613)
IgM (Immunoglobulin M), Srm: 15 mg/dL — ABNORMAL LOW (ref 20–172)

## 2024-06-04 LAB — PROTEIN ELECTROPHORESIS, SERUM, WITH REFLEX
A/G Ratio: 1.2 (ref 0.7–1.7)
Albumin ELP: 3.3 g/dL (ref 2.9–4.4)
Alpha-1-Globulin: 0.2 g/dL (ref 0.0–0.4)
Alpha-2-Globulin: 1 g/dL (ref 0.4–1.0)
Beta Globulin: 1 g/dL (ref 0.7–1.3)
Gamma Globulin: 0.6 g/dL (ref 0.4–1.8)
Globulin, Total: 2.8 g/dL (ref 2.2–3.9)
M-Spike, %: 0.2 g/dL — ABNORMAL HIGH
SPEP Interpretation: 0
Total Protein ELP: 6.1 g/dL (ref 6.0–8.5)

## 2024-06-04 LAB — IMMUNOFIXATION REFLEX, SERUM
IgA: 48 mg/dL — ABNORMAL LOW (ref 61–437)
IgG (Immunoglobin G), Serum: 626 mg/dL (ref 603–1613)
IgM (Immunoglobulin M), Srm: 14 mg/dL — ABNORMAL LOW (ref 20–172)

## 2024-06-11 ENCOUNTER — Other Ambulatory Visit: Payer: Self-pay

## 2024-06-12 ENCOUNTER — Inpatient Hospital Stay: Attending: Hematology & Oncology

## 2024-06-12 ENCOUNTER — Inpatient Hospital Stay

## 2024-06-12 ENCOUNTER — Other Ambulatory Visit: Payer: Self-pay

## 2024-06-12 ENCOUNTER — Inpatient Hospital Stay: Admitting: Hematology & Oncology

## 2024-06-12 ENCOUNTER — Encounter: Payer: Self-pay | Admitting: Hematology & Oncology

## 2024-06-12 ENCOUNTER — Encounter: Payer: Self-pay | Admitting: *Deleted

## 2024-06-12 VITALS — BP 133/73 | HR 75 | Temp 97.9°F | Resp 16 | Ht 70.0 in | Wt 232.0 lb

## 2024-06-12 DIAGNOSIS — D472 Monoclonal gammopathy: Secondary | ICD-10-CM

## 2024-06-12 DIAGNOSIS — Z5112 Encounter for antineoplastic immunotherapy: Secondary | ICD-10-CM | POA: Diagnosis not present

## 2024-06-12 DIAGNOSIS — C9000 Multiple myeloma not having achieved remission: Secondary | ICD-10-CM | POA: Diagnosis not present

## 2024-06-12 LAB — CBC WITH DIFFERENTIAL (CANCER CENTER ONLY)
Abs Immature Granulocytes: 0.03 K/uL (ref 0.00–0.07)
Basophils Absolute: 0.1 K/uL (ref 0.0–0.1)
Basophils Relative: 1 %
Eosinophils Absolute: 0.3 K/uL (ref 0.0–0.5)
Eosinophils Relative: 3 %
HCT: 51.9 % (ref 39.0–52.0)
Hemoglobin: 18.2 g/dL — ABNORMAL HIGH (ref 13.0–17.0)
Immature Granulocytes: 0 %
Lymphocytes Relative: 16 %
Lymphs Abs: 1.4 K/uL (ref 0.7–4.0)
MCH: 33 pg (ref 26.0–34.0)
MCHC: 35.1 g/dL (ref 30.0–36.0)
MCV: 94.2 fL (ref 80.0–100.0)
Monocytes Absolute: 0.7 K/uL (ref 0.1–1.0)
Monocytes Relative: 9 %
Neutro Abs: 6 K/uL (ref 1.7–7.7)
Neutrophils Relative %: 71 %
Platelet Count: 252 K/uL (ref 150–400)
RBC: 5.51 MIL/uL (ref 4.22–5.81)
RDW: 13 % (ref 11.5–15.5)
WBC Count: 8.4 K/uL (ref 4.0–10.5)
nRBC: 0 % (ref 0.0–0.2)

## 2024-06-12 LAB — CMP (CANCER CENTER ONLY)
ALT: 28 U/L (ref 0–44)
AST: 29 U/L (ref 15–41)
Albumin: 4.2 g/dL (ref 3.5–5.0)
Alkaline Phosphatase: 60 U/L (ref 38–126)
Anion gap: 14 (ref 5–15)
BUN: 25 mg/dL — ABNORMAL HIGH (ref 8–23)
CO2: 22 mmol/L (ref 22–32)
Calcium: 9.6 mg/dL (ref 8.9–10.3)
Chloride: 102 mmol/L (ref 98–111)
Creatinine: 1.86 mg/dL — ABNORMAL HIGH (ref 0.61–1.24)
GFR, Estimated: 39 mL/min — ABNORMAL LOW (ref >60)
Glucose, Bld: 94 mg/dL (ref 70–99)
Potassium: 4.3 mmol/L (ref 3.5–5.1)
Sodium: 137 mmol/L (ref 135–145)
Total Bilirubin: 0.4 mg/dL (ref 0.0–1.2)
Total Protein: 6.6 g/dL (ref 6.5–8.1)

## 2024-06-12 LAB — LACTATE DEHYDROGENASE: LDH: 180 U/L (ref 98–192)

## 2024-06-12 MED ORDER — DARATUMUMAB-HYALURONIDASE-FIHJ 1800-30000 MG-UT/15ML ~~LOC~~ SOLN
1800.0000 mg | Freq: Once | SUBCUTANEOUS | Status: AC
  Administered 2024-06-12: 1800 mg via SUBCUTANEOUS
  Filled 2024-06-12: qty 15

## 2024-06-12 MED ORDER — DEXAMETHASONE 4 MG PO TABS: 20.0000 mg | ORAL_TABLET | Freq: Once | ORAL | Status: DC

## 2024-06-12 MED ORDER — DIPHENHYDRAMINE HCL 25 MG PO CAPS: 50.0000 mg | ORAL_CAPSULE | Freq: Once | ORAL | Status: DC

## 2024-06-12 MED ORDER — ACETAMINOPHEN 325 MG PO TABS: 650.0000 mg | ORAL_TABLET | Freq: Once | ORAL | Status: DC

## 2024-06-12 NOTE — Progress Notes (Unsigned)
 Patient is doing well with treatment. He will continue as ordered.   Oncology Nurse Navigator Documentation     06/12/2024    9:15 AM  Oncology Nurse Navigator Flowsheets  Navigator Follow Up Date: 07/24/2024  Navigator Follow Up Reason: Follow-up Appointment;Chemotherapy  Navigator Location [facility]  Navigator Encounter Type Follow-up Appt  Patient Visit Type MedOnc  Treatment Phase Active Tx  Barriers/Navigation Needs No Barriers At This Time  Interventions None Required  Acuity Level 1-No Barriers  Time Spent with Patient 15

## 2024-06-12 NOTE — Progress Notes (Signed)
 Hematology and Oncology Follow Up Visit  Tayler Lassen [id number] [birth year] 68 y.o. 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 Hyun is here today for follow-up .  As expected, he has responded very very nicely.  His last monoclonal spike was 0.2 g/dL.  His IgG level was 626 mg/dL.  The Kappa light chain was down to 3.5 mg/dL.  He is  responding nicely.  He has had no problems with side effects.  He still exercising.  He works out.  He has had no cough.  He has had no nausea or vomiting.  Has had no change in bowel or bladder habits.  He has had no rashes.  There has been no bleeding.  Overall, I will say that his performance status is ECOG 0.  .   Medications:  Allergies as of 06/12/2024       Reactions   Nsaids Other (See Comments)   Avoids due to low kidney function, tolerates low dose aspirin         Medication List        Accurate as of June 12, 2024  9:16 AM. If you have any questions, ask your nurse or doctor.          aspirin EC 81 MG tablet Take 81 mg by mouth daily.   BENEFIBER PO Take by mouth daily.   [employer] Vitamin D3 250 MCG (10000 UT) Caps Generic drug: Cholecalciferol Take 10,000 Units by mouth daily.   dexamethasone  4 MG tablet Commonly known as: DECADRON  Take 5 tablets the morning of chemotherapy   Farxiga 10 MG Tabs tablet Generic drug: dapagliflozin propanediol Take 10 mg by mouth daily.   Fish Oil 500 MG Caps Take by mouth daily.   losartan 50 MG tablet Commonly known as: COZAAR Take 50 mg by mouth daily.   multivitamin tablet Take 1 tablet by mouth daily.   ondansetron  8 MG tablet Commonly known as: Zofran  Take 1 tablet (8 mg total) by mouth every 8 (eight) hours as needed for nausea or vomiting.   QC TUMERIC COMPLEX PO Take 1,000 mg by mouth.   vitamin k 100 MCG tablet Take 100 mcg by mouth daily.         Allergies:  Allergies  Allergen Reactions   Nsaids Other (See Comments)    Avoids due to low kidney function, tolerates low dose aspirin     Past Medical History, Surgical history, Social history, and Family History were reviewed and updated.  Review of Systems:  Review of Systems  Constitutional: Negative.   HENT: Negative.    Eyes: Negative.   Respiratory: Negative.    Cardiovascular: Negative.   Gastrointestinal: Negative.   Genitourinary: Negative.   Musculoskeletal: Negative.   Skin: Negative.   Neurological: Negative.   Endo/Heme/Allergies: Negative.   Psychiatric/Behavioral: Negative.        Physical Exam:  height is 5' 10 (1.778 m) and weight is 232 lb (105.2 kg). His oral temperature is 97.9 F (36.6 C). His blood pressure is 133/73 and his pulse is 75. His respiration is 16 and oxygen saturation is 97%.   Wt Readings from Last 3 Encounters:  06/12/24 232 lb (105.2 kg)  05/29/24 237 lb 12.8 oz (107.9 kg)  04/17/24 229 lb (103.9 kg)    Physical Exam Vitals reviewed.  HENT:     Head:  Normocephalic and atraumatic.  Eyes:     Pupils: Pupils are equal, round, and reactive to light.  Cardiovascular:     Rate and Rhythm: Normal rate and regular rhythm.     Heart sounds: Normal heart sounds.  Pulmonary:     Effort: Pulmonary effort is normal.     Breath sounds: Normal breath sounds.  Abdominal:     General: Bowel sounds are normal.     Palpations: Abdomen is soft.  Musculoskeletal:        General: No tenderness or deformity. Normal range of motion.     Cervical back: Normal range of motion.  Lymphadenopathy:     Cervical: No cervical adenopathy.  Skin:    General: Skin is warm and dry.     Findings: No erythema or rash.  Neurological:     Mental Status: He is alert and oriented to person, place, and time.  Psychiatric:        Behavior: Behavior normal.        Thought Content: Thought content normal.        Judgment: Judgment normal.       Lab Results  Component Value Date   WBC 8.4 06/12/2024   HGB 18.2 (H) 06/12/2024   HCT 51.9 06/12/2024   MCV 94.2 06/12/2024   PLT 252 06/12/2024   Lab Results  Component Value Date   FERRITIN 37 12/14/2023   IRON 152 12/14/2023   TIBC 360 12/14/2023   UIBC 208 12/14/2023   IRONPCTSAT 42 (H) 12/14/2023   Lab Results  Component Value Date   RETICCTPCT 1.1 12/14/2023   RBC 5.51 06/12/2024   Lab Results  Component Value Date   KPAFRELGTCHN 35.4 (H) 05/29/2024   LAMBDASER 6.6 05/29/2024   KAPLAMBRATIO 5.36 (H) 05/29/2024   Lab Results  Component Value Date   IGGSERUM 626 05/29/2024   IGA 48 (L) 05/29/2024   IGMSERUM 14 (L) 05/29/2024   Lab Results  Component Value Date   TOTALPROTELP 6.1 05/29/2024   ALBUMINELP 3.3 05/29/2024   A1GS 0.2 05/29/2024   A2GS 1.0 05/29/2024   BETS 1.0 05/29/2024   GAMS 0.6 05/29/2024   MSPIKE 0.2 (H) 05/29/2024   SPEI Comment 04/17/2024     Chemistry      Component Value Date/Time   NA 137 06/12/2024 0828   K 4.3 06/12/2024 0828   CL 102 06/12/2024 0828   CO2 22 06/12/2024 0828   BUN 25 (H) 06/12/2024 0828   CREATININE 1.86 (H) 06/12/2024 0828      Component Value Date/Time   CALCIUM 9.6 06/12/2024 0828   ALKPHOS 60 06/12/2024 0828   AST 29 06/12/2024 0828   ALT 28 06/12/2024 0828   BILITOT 0.4 06/12/2024 0828       Impression and Plan: Mr. 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   Again, he is doing quite well.  His myeloma levels have come down as expected.  I am very happy about this.  He is getting the Faspro every 2 weeks.  He will get this through November and then in December, he will go to monthly.  We will plan to see him back in about 6 weeks from my perspective.  Again, he is done very well.    Maude JONELLE Crease, MD 10/7/20259:16 AM

## 2024-06-12 NOTE — Progress Notes (Signed)
 OK to treat with creat-1.86 per order of Dr. Timmy.

## 2024-06-13 ENCOUNTER — Ambulatory Visit: Payer: Self-pay | Admitting: Hematology & Oncology

## 2024-06-13 ENCOUNTER — Encounter: Payer: Self-pay | Admitting: Hematology & Oncology

## 2024-06-13 LAB — IGG, IGA, IGM
IgA: 54 mg/dL — ABNORMAL LOW (ref 61–437)
IgG (Immunoglobin G), Serum: 619 mg/dL (ref 603–1613)
IgM (Immunoglobulin M), Srm: 16 mg/dL — ABNORMAL LOW (ref 20–172)

## 2024-06-13 LAB — KAPPA/LAMBDA LIGHT CHAINS
Kappa free light chain: 31.2 mg/L — ABNORMAL HIGH (ref 3.3–19.4)
Kappa, lambda light chain ratio: 5.29 — ABNORMAL HIGH (ref 0.26–1.65)
Lambda free light chains: 5.9 mg/L (ref 5.7–26.3)

## 2024-06-14 ENCOUNTER — Other Ambulatory Visit: Payer: Self-pay

## 2024-06-15 LAB — IMMUNOFIXATION REFLEX, SERUM
IgA: 57 mg/dL — ABNORMAL LOW (ref 61–437)
IgG (Immunoglobin G), Serum: 705 mg/dL (ref 603–1613)
IgM (Immunoglobulin M), Srm: 25 mg/dL (ref 20–172)

## 2024-06-15 LAB — PROTEIN ELECTROPHORESIS, SERUM, WITH REFLEX
A/G Ratio: 1.3 (ref 0.7–1.7)
Albumin ELP: 3.5 g/dL (ref 2.9–4.4)
Alpha-1-Globulin: 0.2 g/dL (ref 0.0–0.4)
Alpha-2-Globulin: 1 g/dL (ref 0.4–1.0)
Beta Globulin: 1 g/dL (ref 0.7–1.3)
Gamma Globulin: 0.5 g/dL (ref 0.4–1.8)
Globulin, Total: 2.6 g/dL (ref 2.2–3.9)
M-Spike, %: 0.2 g/dL — ABNORMAL HIGH
SPEP Interpretation: 0
Total Protein ELP: 6.1 g/dL (ref 6.0–8.5)

## 2024-06-26 ENCOUNTER — Inpatient Hospital Stay

## 2024-06-26 VITALS — BP 133/75 | HR 76 | Temp 97.7°F | Resp 18

## 2024-06-26 DIAGNOSIS — D472 Monoclonal gammopathy: Secondary | ICD-10-CM

## 2024-06-26 DIAGNOSIS — Z5112 Encounter for antineoplastic immunotherapy: Secondary | ICD-10-CM | POA: Diagnosis not present

## 2024-06-26 LAB — CBC WITH DIFFERENTIAL (CANCER CENTER ONLY)
Abs Immature Granulocytes: 0.03 K/uL (ref 0.00–0.07)
Basophils Absolute: 0.1 K/uL (ref 0.0–0.1)
Basophils Relative: 1 %
Eosinophils Absolute: 0.2 K/uL (ref 0.0–0.5)
Eosinophils Relative: 2 %
HCT: 53.1 % — ABNORMAL HIGH (ref 39.0–52.0)
Hemoglobin: 18.3 g/dL — ABNORMAL HIGH (ref 13.0–17.0)
Immature Granulocytes: 0 %
Lymphocytes Relative: 11 %
Lymphs Abs: 1.2 K/uL (ref 0.7–4.0)
MCH: 33 pg (ref 26.0–34.0)
MCHC: 34.5 g/dL (ref 30.0–36.0)
MCV: 95.8 fL (ref 80.0–100.0)
Monocytes Absolute: 0.8 K/uL (ref 0.1–1.0)
Monocytes Relative: 8 %
Neutro Abs: 8.6 K/uL — ABNORMAL HIGH (ref 1.7–7.7)
Neutrophils Relative %: 78 %
Platelet Count: 240 K/uL (ref 150–400)
RBC: 5.54 MIL/uL (ref 4.22–5.81)
RDW: 12.8 % (ref 11.5–15.5)
WBC Count: 10.9 K/uL — ABNORMAL HIGH (ref 4.0–10.5)
nRBC: 0 % (ref 0.0–0.2)

## 2024-06-26 MED ORDER — DEXAMETHASONE 4 MG PO TABS: 20.0000 mg | ORAL_TABLET | Freq: Once | ORAL | Status: DC

## 2024-06-26 MED ORDER — ACETAMINOPHEN 325 MG PO TABS: 650.0000 mg | ORAL_TABLET | Freq: Once | ORAL | Status: DC

## 2024-06-26 MED ORDER — DARATUMUMAB-HYALURONIDASE-FIHJ 1800-30000 MG-UT/15ML ~~LOC~~ SOLN
1800.0000 mg | Freq: Once | SUBCUTANEOUS | Status: AC
  Administered 2024-06-26: 1800 mg via SUBCUTANEOUS
  Filled 2024-06-26: qty 15

## 2024-06-26 MED ORDER — DIPHENHYDRAMINE HCL 25 MG PO CAPS: 50.0000 mg | ORAL_CAPSULE | Freq: Once | ORAL | Status: DC

## 2024-06-28 ENCOUNTER — Other Ambulatory Visit: Payer: Self-pay

## 2024-07-06 ENCOUNTER — Encounter: Payer: Self-pay | Admitting: Hematology & Oncology

## 2024-07-10 ENCOUNTER — Encounter: Payer: Self-pay | Admitting: *Deleted

## 2024-07-10 ENCOUNTER — Inpatient Hospital Stay: Payer: Self-pay

## 2024-07-10 ENCOUNTER — Inpatient Hospital Stay: Payer: Self-pay | Attending: Hematology & Oncology

## 2024-07-10 NOTE — Progress Notes (Signed)
 Patient has transferred care to [facility].   Oncology Nurse Navigator Documentation     07/10/2024    9:15 AM  Oncology Nurse Navigator Flowsheets  Navigation Complete Date: 07/10/2024  Post Navigation: Continue to Follow Patient? No  Reason Not Navigating Patient: Seeking Care elsewhere  Navigator Location [facility]  Navigator Encounter Type Appt/Treatment Plan Review  Time Spent with Patient 15

## 2024-07-10 NOTE — Progress Notes (Signed)
 Patient came into the office today requesting medical records be sent to [facility] for continuation of care. He completed ROI. He states [facility] has stated they sent [facility] twice and [facility] is saying [facility] receive them. He also states that the [place] says they cannot see the records in Care Everywhere.   Path and office notes from last 6 months routed to [place].   Oncology Nurse Navigator Documentation     07/10/2024    1:00 PM  Oncology Nurse Navigator Flowsheets  Navigator Location [facility]  Navigator Encounter Type Lobby  Patient Visit Type MedOnc  Treatment Phase Active Tx  Barriers/Navigation Needs No Barriers At This Time  Interventions Coordination of Care  Acuity Level 1-No Barriers  Coordination of Care Other  Time Spent with Patient 30

## 2024-07-24 ENCOUNTER — Inpatient Hospital Stay: Payer: Self-pay

## 2024-07-24 ENCOUNTER — Inpatient Hospital Stay: Payer: Self-pay | Admitting: Hematology & Oncology

## 2024-07-26 ENCOUNTER — Encounter: Payer: Self-pay | Admitting: Hematology & Oncology

## 2024-08-28 ENCOUNTER — Other Ambulatory Visit: Payer: Self-pay | Admitting: Hematology
